# Patient Record
Sex: Male | Born: 1965 | Race: Black or African American | Hispanic: No | Smoking: Current every day smoker
Health system: Southern US, Community
[De-identification: ages and names within clinical notes are randomized; demographics above are authoritative.]

## PROBLEM LIST (undated history)

## (undated) DIAGNOSIS — H469 Unspecified optic neuritis: Secondary | ICD-10-CM

## (undated) DIAGNOSIS — G35 Multiple sclerosis: Principal | ICD-10-CM

## (undated) DIAGNOSIS — G43909 Migraine, unspecified, not intractable, without status migrainosus: Secondary | ICD-10-CM

## (undated) DIAGNOSIS — F5104 Psychophysiologic insomnia: Secondary | ICD-10-CM

## (undated) HISTORY — DX: Unspecified optic neuritis: H46.9

## (undated) HISTORY — DX: Psychophysiologic insomnia: F51.04

## (undated) HISTORY — PX: KNEE SURGERY: SHX244

## (undated) HISTORY — DX: Migraine, unspecified, not intractable, without status migrainosus: G43.909

## (undated) HISTORY — PX: HAND TENDON SURGERY: SHX663

## (undated) HISTORY — DX: Multiple sclerosis: G35

## (undated) HISTORY — PX: HERNIA REPAIR: SHX51

## (undated) HISTORY — PX: ROTATOR CUFF REPAIR: SHX139

---

## 2005-06-18 ENCOUNTER — Encounter: Admission: RE | Admit: 2005-06-18 | Discharge: 2005-06-18 | Payer: Self-pay | Admitting: Family Medicine

## 2006-07-25 ENCOUNTER — Emergency Department (HOSPITAL_COMMUNITY): Admission: EM | Admit: 2006-07-25 | Discharge: 2006-07-25 | Payer: Self-pay | Admitting: Emergency Medicine

## 2006-08-14 ENCOUNTER — Encounter: Admission: RE | Admit: 2006-08-14 | Discharge: 2006-08-14 | Payer: Self-pay | Admitting: Family Medicine

## 2006-09-18 ENCOUNTER — Encounter: Admission: RE | Admit: 2006-09-18 | Discharge: 2006-09-18 | Payer: Self-pay | Admitting: Family Medicine

## 2007-02-10 ENCOUNTER — Ambulatory Visit (HOSPITAL_BASED_OUTPATIENT_CLINIC_OR_DEPARTMENT_OTHER): Admission: RE | Admit: 2007-02-10 | Discharge: 2007-02-10 | Payer: Self-pay | Admitting: General Surgery

## 2010-05-29 NOTE — Op Note (Signed)
NAMEChristopherjohn, Joshua Boyer                ACCOUNT NO.:  [id number]   MEDICAL RECORD NO.:  [id number]          PATIENT TYPE:  AMB   LOCATION:  DSC                          FACILITY:  [facility]   PHYSICIAN:  Cherylynn Ridges, M.D.    DATE OF BIRTH:  23-Sep-1965   DATE OF PROCEDURE:  02/10/2007  DATE OF DISCHARGE:                               OPERATIVE REPORT   PREOPERATIVE DIAGNOSIS:  Left inguinal hernia.   POSTOPERATIVE DIAGNOSIS:  Left indirect inguinal hernia.   PROCEDURE:  Repair of left inguinal hernia with mesh.   SURGEON:  Cherylynn Ridges, M.D.   ANESTHESIA:  General with laryngeal airway.   ESTIMATED BLOOD LOSS:  Less than 10 mL.   COMPLICATIONS:  None.   CONDITION:  Stable.   INDICATIONS FOR OPERATION:  The patient is a 45 year old with  symptomatic left inguinal hernia who comes in now for repair.   FINDINGS:  He had a scarred left indirect inguinal hernia with mild  weakness of the floor.   OPERATION:  The patient was taken to the operating room, placed on the  table in supine position.  After an adequate general laryngeal airway  anesthetic was administered, he was prepped and draped in usual sterile  manner exposing the left side.   We made a transverse curvilinear incision at the level of the  superficial ring using a #10 blade and taken down to the external  oblique fascia which was then opened along its fibers down through the  superficial ring.  The ilioinguinal nerve could be easily identified and  was mobilized along with the spermatic cord.  We placed the spermatic  cord up on a work bench using a Penrose drain then dissected out the  hernia sac on the anterior medial aspect of the cord.  This sac was  dissected away from the spermatic cord and then suture ligated at its  base x2 with 0 Ethibond suture.  The excess sac was transected and  removed, then we repaired the floor using an oval piece of polypropylene  mesh measuring approximately 4.5 x 2 cm in size  attaching it to the  conjoined tendon anteromedially and reflected portion of the inguinal  ligament inferolaterally.  This was done with a running 0 Prolene  suture.  Once the mesh was in place and it was made sure that the  ilioinguinal nerve which came out at the internal ring was not  entrapped, we closed the external oblique fascia on top of the cord and  the ilioinguinal nerve using running 3-0 Vicryl suture.  Again care was  taken not to entrap the nerve.  The Scarpa's fascia was reapproximated  using interrupted 3-0 Vicryl sutures and the  skin, after injecting with 0.50% Marcaine without epinephrine, was  closed using running subcuticular stitch of 4-0 Monocryl.  Dermabond,  Steri-Strips and Tegaderm were applied.  All counts were correct  including needles, sponges and instruments.      Cherylynn Ridges, M.D.  Electronically Signed     JOW/MEDQ  D:  02/10/2007  T:  02/10/2007  Job:  [id number]

## 2010-10-04 LAB — COMPREHENSIVE METABOLIC PANEL
Albumin: 4.3
Creatinine, Ser: 0.95
GFR calc Af Amer: >60
Sodium: 139
Total Bilirubin: 0.4

## 2010-10-04 LAB — CBC
Hemoglobin: 14.4
MCV: 92.5
RDW: 13.9
WBC: 11.8 — ABNORMAL HIGH

## 2010-10-04 LAB — DIFFERENTIAL
Basophils Absolute: 0
Basophils Relative: 0
Eosinophils Absolute: 0.3
Eosinophils Relative: 2
Lymphs Abs: 4
Neutro Abs: 6.5
Neutrophils Relative %: 55

## 2012-04-18 ENCOUNTER — Emergency Department (HOSPITAL_BASED_OUTPATIENT_CLINIC_OR_DEPARTMENT_OTHER)
Admission: EM | Admit: 2012-04-18 | Discharge: 2012-04-18 | Disposition: A | Discharge status: Home / Self Care | Payer: BC Managed Care – PPO | Attending: Emergency Medicine | Admitting: Emergency Medicine

## 2012-04-18 ENCOUNTER — Encounter (HOSPITAL_BASED_OUTPATIENT_CLINIC_OR_DEPARTMENT_OTHER): Payer: Self-pay

## 2012-04-18 ENCOUNTER — Emergency Department (HOSPITAL_BASED_OUTPATIENT_CLINIC_OR_DEPARTMENT_OTHER): Payer: BC Managed Care – PPO

## 2012-04-18 DIAGNOSIS — W208XXA Other cause of strike by thrown, projected or falling object, initial encounter: Secondary | ICD-10-CM | POA: Insufficient documentation

## 2012-04-18 DIAGNOSIS — Z23 Encounter for immunization: Secondary | ICD-10-CM | POA: Insufficient documentation

## 2012-04-18 DIAGNOSIS — S61209A Unspecified open wound of unspecified finger without damage to nail, initial encounter: Secondary | ICD-10-CM | POA: Insufficient documentation

## 2012-04-18 DIAGNOSIS — Z9889 Other specified postprocedural states: Secondary | ICD-10-CM | POA: Insufficient documentation

## 2012-04-18 DIAGNOSIS — Y939 Activity, unspecified: Secondary | ICD-10-CM | POA: Insufficient documentation

## 2012-04-18 DIAGNOSIS — S62639A Displaced fracture of distal phalanx of unspecified finger, initial encounter for closed fracture: Secondary | ICD-10-CM | POA: Insufficient documentation

## 2012-04-18 DIAGNOSIS — Z79899 Other long term (current) drug therapy: Secondary | ICD-10-CM | POA: Insufficient documentation

## 2012-04-18 DIAGNOSIS — G35 Multiple sclerosis: Secondary | ICD-10-CM | POA: Insufficient documentation

## 2012-04-18 DIAGNOSIS — S62639B Displaced fracture of distal phalanx of unspecified finger, initial encounter for open fracture: Secondary | ICD-10-CM

## 2012-04-18 DIAGNOSIS — S61319A Laceration without foreign body of unspecified finger with damage to nail, initial encounter: Secondary | ICD-10-CM

## 2012-04-18 DIAGNOSIS — Y9289 Other specified places as the place of occurrence of the external cause: Secondary | ICD-10-CM | POA: Insufficient documentation

## 2012-04-18 HISTORY — DX: Multiple sclerosis: G35

## 2012-04-18 MED ORDER — HYDROCODONE-ACETAMINOPHEN 5-325 MG PO TABS: 1.0000 | ORAL_TABLET | Freq: Four times a day (QID) | ORAL | Status: DC | PRN

## 2012-04-18 MED ORDER — TETANUS-DIPHTH-ACELL PERTUSSIS 5-2.5-18.5 LF-MCG/0.5 IM SUSP
0.5000 mL | Freq: Once | INTRAMUSCULAR | Status: AC
  Administered 2012-04-18: 0.5 mL via INTRAMUSCULAR
  Filled 2012-04-18: qty 0.5

## 2012-04-18 MED ORDER — CEPHALEXIN 500 MG PO CAPS: 500.0000 mg | ORAL_CAPSULE | Freq: Four times a day (QID) | ORAL | Status: DC

## 2012-04-18 NOTE — ED Provider Notes (Signed)
History     CSN: [id number]  Arrival date & time 04/18/12  1333   First MD Initiated Contact with Patient 04/18/12 1406      Chief Complaint  Patient presents with  . Finger Injury    (Consider location/radiation/quality/duration/timing/severity/associated sxs/prior treatment) HPI Comments: Patient presents with a crushing injury of the right 4th digit.  He reports that approximately one hour prior to arrival a window fell unto his finger crushing it.  He currently has a laceration extending across the right 4th nail.  No treatment prior to arrival.  He reports that the finger is painful.  Bleeding controlled at this time.  He has full ROM of his finger.  Denies numbness or tingling.  Date of last tetanus unknown.  The history is provided by the patient.    Past Medical History  Diagnosis Date  . MS (multiple sclerosis)     Past Surgical History  Procedure Laterality Date  . Hernia repair    . Rotator cuff repair    . Knee surgery    . Hand tendon surgery      History reviewed. No pertinent family history.  History  Substance Use Topics  . Smoking status: Never Smoker   . Smokeless tobacco: Never Used  . Alcohol Use: Yes     Comment: social      Review of Systems  Skin: Positive for wound.    Allergies  Review of patient's allergies indicates no known allergies.  Home Medications   Current Outpatient Rx  Name  Route  Sig  Dispense  Refill  . Fingolimod HCl (GILENYA) 0.5 MG CAPS   Oral   Take 0.5 mg by mouth daily.           BP 139/67  Pulse 86  Temp(Src) 97.9 F (36.6 C) (Oral)  Resp 16  Ht 6\' 4"  (1.93 m)  Wt 191 lb (86.637 kg)  BMI 23.26 kg/m2  SpO2 100%  Physical Exam  Nursing note and vitals reviewed. Constitutional: He appears well-developed and well-nourished. No distress.  HENT:  Head: Normocephalic and atraumatic.  Neck: Normal range of motion. Neck supple.  Cardiovascular: Normal rate, regular rhythm and normal heart sounds.    Pulmonary/Chest: Effort normal and breath sounds normal.  Musculoskeletal:  Full ROM of the right 4th digit MCP, PIP, and DIP.  Neurological: He is alert.  Skin: Skin is warm and dry. He is not diaphoretic.  Laceration through the finger nail of the right 4th digit. Distal portion of the nail elevated.  Lateral edge not intact to the nail bed.  Psychiatric: He has a normal mood and affect.    ED Course  NAIL REMOVAL Date/Time: 04/20/2012 3:11 PM Performed by: Anne Shutter, Jeffey Janssen Authorized by: Anne Shutter, Herbert Seta Consent: Verbal consent obtained. written consent not obtained. Risks and benefits: risks, benefits and alternatives were discussed Consent given by: patient Patient understanding: patient states understanding of the procedure being performed Patient consent: the patient's understanding of the procedure matches consent given Procedure consent: procedure consent matches procedure scheduled Patient identity confirmed: verbally with patient Location: right hand Location details: right ring finger Anesthesia: digital block Local anesthetic: lidocaine 2% without epinephrine Anesthetic total: 5 ml Patient sedated: no Preparation: skin prepped with Betadine Nail removal amount: distal 2/3. Nail bed sutured: yes Suture material: 6-0 Chromic gut Number of sutures: 4 Removed nail replaced and anchored: no Dressing: antibiotic ointment, splint and dressing applied Patient tolerance: Patient tolerated the procedure well with no immediate complications.   (  including critical care time)  Labs Reviewed - No data to display Dg Finger Ring Right  04/18/2012  *RADIOLOGY REPORT*  Clinical Data: Injury.  RIGHT RING FINGER 2+V  Comparison: None.  Findings: Fracture of the tuft of the right fourth finger distal phalanx.  IMPRESSION: Fracture of the tuft of the right fourth finger distal phalanx.   Original Report Authenticated By: Lacy Duverney, M.D.      No diagnosis found.   Patient  discussed with Dr. Fredderick Phenix. MDM  Patient presenting with an injury to the right 4th digit finger nail that occurred after a window fell on it.  Xray shows an underlying tuft fracture.  Finger nail removed and nail bed repaired with sutures.  Patient neurovascularly intact.  Patient discharged home with finger splint and pain medication.  Patient also started on antibiotic due to the fact that it is an open fracture.  Tetanus updated in the ED.        Pascal Lux [place], PA-C 04/20/12 [phone]

## 2012-04-18 NOTE — ED Notes (Signed)
Pt states that he had a crushing injury to his R ring finger, a window fell on it.  Pt went to [facility] who sent him here because they were not comfortable with the injury stating that his fingernail may need to be removed.

## 2012-04-21 NOTE — ED Provider Notes (Signed)
Medical screening examination/treatment/procedure(s) were performed by non-physician practitioner and as supervising physician I was immediately available for consultation/collaboration.   Reginold Beale, MD 04/21/12 0706 

## 2012-08-05 ENCOUNTER — Ambulatory Visit: Payer: Self-pay | Admitting: Neurology

## 2012-08-27 ENCOUNTER — Telehealth: Payer: Self-pay | Admitting: Neurology

## 2012-08-31 NOTE — Telephone Encounter (Signed)
I called patient. The patient had a transient episode of visual disturbance lasting only about 1 minute. Episode was associated with "looking through rain". The patient has a history of migraine headache, and he has had visual disturbances before. This likely represented a migrainous event. The patient is on Gilenya, and he will have a routine ophthalmologic evaluation soon. I do not believe that this visual changes related to the Gilenya.

## 2012-11-11 ENCOUNTER — Telehealth: Payer: Self-pay | Admitting: Neurology

## 2012-11-11 NOTE — Telephone Encounter (Signed)
I called patient. The patient indicates that he went to see the ophthalmologist, and there were no abnormalities seen. The patient will need a revisit to this office. The visual complaints he was having previously have cleared up completely. He is doing well on Gilenya. I will get a revisit sometime within the next 3 weeks or so.

## 2012-11-11 NOTE — Telephone Encounter (Addendum)
Patient said that he recently has seen an eye doctor(10/30/12) and he could not find anything  wrong with his eyes, suggested a  MRI also wanted to know the next step going forward

## 2012-11-13 ENCOUNTER — Telehealth: Payer: Self-pay | Admitting: *Deleted

## 2012-11-27 ENCOUNTER — Encounter: Payer: Self-pay | Admitting: Neurology

## 2012-11-27 ENCOUNTER — Ambulatory Visit (INDEPENDENT_AMBULATORY_CARE_PROVIDER_SITE_OTHER): Payer: BC Managed Care – PPO | Admitting: Neurology

## 2012-11-27 VITALS — BP 122/80 | HR 79 | Wt 204.0 lb

## 2012-11-27 DIAGNOSIS — G35 Multiple sclerosis: Secondary | ICD-10-CM

## 2012-11-27 DIAGNOSIS — H469 Unspecified optic neuritis: Secondary | ICD-10-CM

## 2012-11-27 DIAGNOSIS — Z5181 Encounter for therapeutic drug level monitoring: Secondary | ICD-10-CM

## 2012-11-27 HISTORY — DX: Multiple sclerosis: G35

## 2012-11-27 LAB — COMPREHENSIVE METABOLIC PANEL
Albumin/Globulin Ratio: 1.8 (ref 1.1–2.5)
Albumin: 4.4 g/dL (ref 3.5–5.5)
Alkaline Phosphatase: 54 IU/L (ref 39–117)
BUN/Creatinine Ratio: 17 (ref 9–20)
CO2: 31 mmol/L — ABNORMAL HIGH (ref 18–29)
Calcium: 9.5 mg/dL (ref 8.7–10.2)
GFR calc non Af Amer: 82 mL/min/{1.73_m2} (ref >59)
Glucose: 111 mg/dL — ABNORMAL HIGH (ref 65–99)
Sodium: 141 mmol/L (ref 134–144)
Total Protein: 6.9 g/dL (ref 6.0–8.5)

## 2012-11-27 LAB — CBC WITH DIFFERENTIAL
Basophils Absolute: 0 10*3/uL (ref 0.0–0.2)
HCT: 40.5 % (ref 37.5–51.0)
Hemoglobin: 14 g/dL (ref 12.6–17.7)
Lymphocytes Absolute: 0.7 10*3/uL (ref 0.7–3.1)
MCH: 31.3 pg (ref 26.6–33.0)
MCV: 91 fL (ref 79–97)
Monocytes Absolute: 0.8 10*3/uL (ref 0.1–0.9)
Monocytes: 12 %
Neutrophils Absolute: 4.7 10*3/uL (ref 1.4–7.0)
Neutrophils Relative %: 75 %
WBC: 6.3 10*3/uL (ref 3.4–10.8)

## 2012-11-27 MED ORDER — FINGOLIMOD HCL 0.5 MG PO CAPS: 0.5000 mg | ORAL_CAPSULE | Freq: Every day | ORAL | Status: DC

## 2012-11-27 MED ORDER — VITAMIN D3 25 MCG (1000 UT) PO CAPS: 1000.0000 [IU] | ORAL_CAPSULE | Freq: Every day | ORAL | Status: AC

## 2012-11-27 NOTE — Progress Notes (Signed)
    Reason for visit: Multiple sclerosis  Joshua Boyer is an 47 y.o. male  History of present illness:  Mr. Joshua Boyer is a 47 year old right-handed black male with a history of multiple sclerosis. The patient has a history of a left optic neuritis. The patient was placed on Gilenya given a very active MRI brain study for MS lesions. The patient has done quite well on Gilenya, without any side effects. Occasionally, he will note some numbness and tingling in the toes at nighttime, but this does not last long. The patient denies any balance problems, or problems controlling the bowels or the bladder. The patient not had any visual field changes, or double vision. The patient reports no weakness of the extremities. The patient denies any significant fatigue. The patient returns to the office today for further evaluation. The patient has been to his ophthalmologist recently, and he has indicated that his vision has actually improved.  Past Medical History  Diagnosis Date  . MS (multiple sclerosis)   . Multiple sclerosis 11/27/2012  . Optic neuritis, left   . Migraine headache     Past Surgical History  Procedure Laterality Date  . Hernia repair    . Rotator cuff repair    . Knee surgery    . Hand tendon surgery      Family History  Problem Relation Age of Onset  . Glaucoma Mother   . Diabetes Mother   . Stroke Father   . Heart attack Father   . Cancer - Prostate Father     Social history:  reports that he has quit smoking. He has never used smokeless tobacco. He reports that he drinks alcohol. He reports that he does not use illicit drugs.   No Known Allergies  Medications:  No current outpatient prescriptions on file prior to visit.   No current facility-administered medications on file prior to visit.    ROS:  Out of a complete 14 system review of symptoms, the patient complains only of the following symptoms, and all other reviewed systems are  negative.  Numbness  Blood pressure 122/80, pulse 79, weight 204 lb (92.534 kg).  Physical Exam  General: The patient is alert and cooperative at the time of the examination.  Skin: No significant peripheral edema is noted.   Neurologic Exam  Mental status: The patient is oriented x 3.  Cranial nerves: Facial symmetry is present. Speech is normal, no aphasia or dysarthria is noted. Extraocular movements are full. Visual fields are full. Pupils are equal, round, and reactive to light. Discs are flat bilaterally. There may be some optic atrophy bilaterally. With rapid eye movements, there appears to be evidence of mild bilateral INO.  Motor: The patient has good strength in all 4 extremities.  Sensory examination: Soft touch sensation on the face, arms, and legs is symmetric.  Coordination: The patient has good finger-nose-finger and heel-to-shin bilaterally.  Gait and station: The patient has a normal gait. Tandem gait is normal. Romberg is negative. No drift is seen.  Reflexes: Deep tendon reflexes are symmetric.   Assessment/Plan:  One. Multiple sclerosis  2. Left optic neuritis  The patient will continue on Gilenya at this time. The patient will have blood work done today, and he will followup in 6 months. The patient appears to be doing quite well.  Joshua Palau MD 11/27/2012 8:05 PM  [facility] [address] [place], [place] [address]  Phone [phone] Fax [phone]

## 2012-11-27 NOTE — Patient Instructions (Signed)
Multiple Sclerosis Multiple sclerosis (MS) is a disease of the central nervous system. Its cause is unknown. It is more common in the northern states than in the southern states. There is a higher incidence of MS in women. There is a wide variation in the symptoms (problems) of MS. This is because of the many different ways it affects the central nervous system. It often comes on in episodes or attacks. These attacks may last weeks to months. There may be long periods of nearly no problems between attacks. The main symptoms include visual problems (associated with eye pain), numbness, weakness, and paralysis in extremities (arms/hands and legs/feet). There may also be tremors and problems with balance and walking. The age when MS starts is variable. Advances in medicine continue to improve the treatment of this illness. There is no known cure for MS but there are medications that help. MS is not an inherited illness, although your risk of getting this disease is higher if you have a relative with MS. The best radiologic (x-ray) study for MS is an MRI (magnetic resonance imaging). There are medications available to decrease the number and frequency of attacks. SYMPTOMS  The symptoms of MS are caused by loss of insulation (myelin) of the nerves of the brain. When this happens, brain signals do not get transmitted properly or may not get transmitted at all. Some of the problems caused by this include:   Numbness.  Weakness.  Paralysis in extremities.  Visual problems, eye pain.  Balance problems.  Tremors. DIAGNOSIS  Your caregiver can do studies on you to make this diagnosis. This may include specialized X-rays and spinal fluid studies. HOME CARE INSTRUCTIONS   Take medications as directed by your caregiver. Baclofen is a drug commonly used to reduce muscle spasticity. Steroids are often used for short term relief.  Exercise as directed.  Use physical and occupational therapy as directed by  your caregiver. Careful attention to this medical care can help avoid depression.  See your caregiver if you begin to have problems with depression. This is a common problem in MS. Patients often continue to work many years after the diagnosis of MS. Document Released: 12/29/1999 Document Revised: 03/25/2011 Document Reviewed: 08/06/2006 [employer] Patient Information 2014 [employer].  

## 2012-12-16 ENCOUNTER — Other Ambulatory Visit: Payer: Self-pay | Admitting: Neurology

## 2012-12-16 ENCOUNTER — Ambulatory Visit (INDEPENDENT_AMBULATORY_CARE_PROVIDER_SITE_OTHER): Payer: BC Managed Care – PPO

## 2012-12-16 DIAGNOSIS — G35 Multiple sclerosis: Secondary | ICD-10-CM

## 2012-12-18 ENCOUNTER — Telehealth: Payer: Self-pay | Admitting: Neurology

## 2012-12-18 NOTE — Telephone Encounter (Signed)
I called patient. MRI study of the brain shows minimal progression, possibly 2 new spots. The patient could not get IV contrast secondary to inability to obtain IV access. If the patient has any clinical progression, or some progression is noted on another study, we will consider switching to dysarthria. For now, we'll stay with Gilenya. I would repeat a MRI the brain in 8-12 months.

## 2012-12-20 ENCOUNTER — Telehealth: Payer: Self-pay

## 2012-12-20 NOTE — Telephone Encounter (Signed)
[employer] sent [place] a letter saying they have approved our request for coverage on Gilenya effective until 01/02/2014 Ref #[id number]

## 2013-02-26 ENCOUNTER — Ambulatory Visit: Payer: Self-pay | Admitting: Neurology

## 2013-05-31 ENCOUNTER — Telehealth: Payer: Self-pay | Admitting: Nurse Practitioner

## 2013-05-31 ENCOUNTER — Ambulatory Visit: Payer: BC Managed Care – PPO | Admitting: Nurse Practitioner

## 2013-05-31 NOTE — Telephone Encounter (Signed)
No show for appt. 

## 2013-06-01 ENCOUNTER — Ambulatory Visit (INDEPENDENT_AMBULATORY_CARE_PROVIDER_SITE_OTHER): Payer: BC Managed Care – PPO | Admitting: Nurse Practitioner

## 2013-06-01 ENCOUNTER — Encounter: Payer: Self-pay | Admitting: Nurse Practitioner

## 2013-06-01 VITALS — BP 120/72 | HR 92 | Ht 74.0 in | Wt 205.0 lb

## 2013-06-01 DIAGNOSIS — H469 Unspecified optic neuritis: Secondary | ICD-10-CM

## 2013-06-01 DIAGNOSIS — Z5181 Encounter for therapeutic drug level monitoring: Secondary | ICD-10-CM

## 2013-06-01 DIAGNOSIS — G35 Multiple sclerosis: Secondary | ICD-10-CM

## 2013-06-01 NOTE — Progress Notes (Signed)
I have read the note, and I agree with the clinical assessment and plan.  Joshua Boyer   

## 2013-06-01 NOTE — Patient Instructions (Signed)
Continue gilenya at current dose Repeat MRI of the brain in October CBC , CMP today Followup in 6 months

## 2013-06-01 NOTE — Progress Notes (Signed)
[facility]  PATIENT: Joshua Boyer DOB: 31-Jan-1965   REASON FOR VISIT:follow up for MS   HISTORY OF PRESENT ILLNESS: Joshua Boyer, 48 year old male returns for followup. He was last seen in this office or Dr. Anne HahnWillis 11/27/2012. He has a history of multiple sclerosis and left optic neuritis. Most recent MRI of the brain in December 2014 shows minimal progression, possibly 2 new spots. The patient could not get IV contrast secondary to inability to obtain IV access. He is currently on gilenya without any side effects. He denies any numbness or tingling, balance issues, trouble controlling the  bowels or bladder. He states that his vision has improved. He denies any weakness, he returns for reevaluation  HISTORY: of multiple sclerosis. The patient has a history of a left optic neuritis. The patient was placed on Gilenya given a very active MRI brain study for MS lesions. The patient has done quite well on Gilenya, without any side effects. Occasionally, he will note some numbness and tingling in the toes at nighttime, but this does not last long. The patient denies any balance problems, or problems controlling the bowels or the bladder. The patient not had any visual field changes, or double vision. The patient reports no weakness of the extremities. The patient denies any significant fatigue. The patient returns to the office today for further evaluation. The patient has been to his ophthalmologist recently, and he has indicated that his vision has actually improved.   REVIEW OF SYSTEMS: Full 14 system review of systems performed and notable only for those listed, all others are neg:  Constitutional: N/A  Cardiovascular: N/A  Ear/Nose/Throat: N/A  Skin: N/A  Eyes: N/A  Respiratory: N/A  Gastroitestinal: N/A  Hematology/Lymphatic: N/A  Endocrine: N/A Musculoskeletal:N/A  Allergy/Immunology: N/A  Neurological: N/A Psychiatric: N/A Sleep : Shift work  ALLERGIES: No  Known Allergies  HOME MEDICATIONS: Outpatient Prescriptions Prior to Visit  Medication Sig Dispense Refill  . Cholecalciferol (VITAMIN D3) 1000 UNITS CAPS Take 1 capsule (1,000 Units total) by mouth daily.      . Fingolimod HCl (GILENYA) 0.5 MG CAPS Take 1 capsule (0.5 mg total) by mouth daily.  90 capsule  3  . naproxen sodium (ANAPROX) 220 MG tablet Take 220 mg by mouth as needed.        No facility-administered medications prior to visit.    PAST MEDICAL HISTORY: Past Medical History  Diagnosis Date  . MS (multiple sclerosis)   . Multiple sclerosis 11/27/2012  . Optic neuritis, left   . Migraine headache     PAST SURGICAL HISTORY: Past Surgical History  Procedure Laterality Date  . Hernia repair    . Rotator cuff repair    . Knee surgery    . Hand tendon surgery      FAMILY HISTORY: Family History  Problem Relation Age of Onset  . Glaucoma Mother   . Diabetes Mother   . Stroke Father   . Heart attack Father   . Cancer - Prostate Father     SOCIAL HISTORY: History   Social History  . Marital Status: Divorced    Spouse Name: N/A    Number of Children: 1  . Years of Education: BS    Occupational History  .  [employer]   Social History Main Topics  . Smoking status: Former Games developermoker  . Smokeless tobacco: Never Used  . Alcohol Use: Yes     Comment: social  . Drug Use: No  . Sexual Activity: No  Other Topics Concern  . Not on file   Social History Narrative   Patient lives at home alone.    Patient has 1 child.    Patient is currently working.    Patient is right handed.    Patient has his BS degree.            PHYSICAL EXAM  Filed Vitals:   06/01/13 1458  BP: 120/72  Pulse: 92  Height: 6\' 2"  (1.88 m)  Weight: 205 lb (92.987 kg)   Body mass index is 26.31 kg/(m^2).  Generalized: Well developed, in no acute distress  Head: normocephalic and atraumatic,. Oropharynx benign  Neck: Supple, no carotid bruits  Cardiac: Regular rate rhythm, no  murmur  Musculoskeletal: No deformity   Neurological examination   Mentation: Alert oriented to time, place, history taking. Follows all commands speech and language fluent  Cranial nerve II-XII: Visual acuity 20/20 right, 2040 left . Fundoscopic exam reveals sharp disc margins.Pupils were equal round reactive to light extraocular movements were full, visual field were full on confrontational test. Facial sensation and strength were normal. hearing was intact to finger rubbing bilaterally. Uvula tongue midline. head turning and shoulder shrug were normal and symmetric.Tongue protrusion into cheek strength was normal. Motor: normal bulk and tone, full strength in the BUE, BLE,  No focal weakness Sensory: normal and symmetric to light touch, pinprick, and  vibration  Coordination: finger-nose-finger, heel-to-shin bilaterally, no dysmetria Reflexes: Brachioradialis 2/2, biceps 2/2, triceps 2/2, patellar 2/2, Achilles 2/2, plantar responses were flexor bilaterally. Gait and Station: Rising up from seated position without assistance, normal stance,  moderate stride, good arm swing, smooth turning, able to perform tiptoe, and heel walking without difficulty. Tandem gait is steady  DIAGNOSTIC DATA (LABS, IMAGING, TESTING) - I reviewed patient records, labs, notes, testing and imaging myself where available.  Lab Results  Component Value Date   WBC 6.3 11/27/2012   HGB 14.0 11/27/2012   HCT 40.5 11/27/2012   MCV 91 11/27/2012   PLT 281 11/27/2012      Component Value Date/Time   NA 141 11/27/2012 1312   NA 139 02/09/2007 1130   K 4.5 11/27/2012 1312   CL 102 11/27/2012 1312   CO2 31* 11/27/2012 1312   GLUCOSE 111* 11/27/2012 1312   GLUCOSE 98 02/09/2007 1130   BUN 18 11/27/2012 1312   BUN 11 02/09/2007 1130   CREATININE 1.08 11/27/2012 1312   CALCIUM 9.5 11/27/2012 1312   PROT 6.9 11/27/2012 1312   PROT 7.1 02/09/2007 1130   ALBUMIN 4.3 02/09/2007 1130   AST 16 11/27/2012 1312   ALT 18  11/27/2012 1312   ALKPHOS 54 11/27/2012 1312   BILITOT 0.4 11/27/2012 1312   GFRNONAA 82 11/27/2012 1312   GFRAA 95 11/27/2012 1312    ASSESSMENT AND PLAN  48 y.o. year old male  has a past medical history of Multiple sclerosis (11/27/2012); Optic neuritis, left; and Migraine headache. here to followup.  Continue gilenya at current dose Repeat MRI of the brain in October and compare to 12/14 CBC , CMP today Followup in 6 months Nilda Riggs, [facility], [facility], APRN  [facility] [address] [place], [place] [address] [phone]

## 2013-06-02 LAB — COMPREHENSIVE METABOLIC PANEL
ALK PHOS: 67 IU/L (ref 39–117)
ALT: 27 IU/L (ref 0–44)
AST: 21 IU/L (ref 0–40)
Albumin/Globulin Ratio: 2 (ref 1.1–2.5)
Albumin: 4.7 g/dL (ref 3.5–5.5)
BUN / CREAT RATIO: 14 (ref 9–20)
BUN: 17 mg/dL (ref 6–24)
CALCIUM: 10.1 mg/dL (ref 8.7–10.2)
CHLORIDE: 99 mmol/L (ref 97–108)
CO2: 28 mmol/L (ref 18–29)
Creatinine, Ser: 1.18 mg/dL (ref 0.76–1.27)
GFR calc Af Amer: 85 mL/min/{1.73_m2} (ref >59)
GFR calc non Af Amer: 74 mL/min/{1.73_m2} (ref >59)
Globulin, Total: 2.4 g/dL (ref 1.5–4.5)
Glucose: 113 mg/dL — ABNORMAL HIGH (ref 65–99)
POTASSIUM: 4.7 mmol/L (ref 3.5–5.2)
SODIUM: 142 mmol/L (ref 134–144)
Total Bilirubin: 0.2 mg/dL (ref 0.0–1.2)
Total Protein: 7.1 g/dL (ref 6.0–8.5)

## 2013-06-02 LAB — CBC WITH DIFFERENTIAL/PLATELET
BASOS: 0 %
Basophils Absolute: 0 10*3/uL (ref 0.0–0.2)
EOS: 1 %
Eosinophils Absolute: 0.1 10*3/uL (ref 0.0–0.4)
HEMATOCRIT: 41.2 % (ref 37.5–51.0)
Hemoglobin: 13.9 g/dL (ref 12.6–17.7)
IMMATURE GRANS (ABS): 0 10*3/uL (ref 0.0–0.1)
IMMATURE GRANULOCYTES: 0 %
LYMPHS: 6 %
Lymphocytes Absolute: 0.7 10*3/uL (ref 0.7–3.1)
MCH: 30.3 pg (ref 26.6–33.0)
MCHC: 33.7 g/dL (ref 31.5–35.7)
MCV: 90 fL (ref 79–97)
MONOCYTES: 9 %
MONOS ABS: 0.9 10*3/uL (ref 0.1–0.9)
NEUTROS PCT: 84 %
Neutrophils Absolute: 8.6 10*3/uL — ABNORMAL HIGH (ref 1.4–7.0)
RBC: 4.58 x10E6/uL (ref 4.14–5.80)
RDW: 14.4 % (ref 12.3–15.4)
WBC: 10.2 10*3/uL (ref 3.4–10.8)

## 2013-06-02 NOTE — Progress Notes (Signed)
Quick Note:  Called and shared labs with patient, he verbalized understanding ______

## 2013-06-02 NOTE — Telephone Encounter (Signed)
This encounter was created in error - please disregard.

## 2013-10-29 ENCOUNTER — Telehealth: Payer: Self-pay | Admitting: Nurse Practitioner

## 2013-10-29 DIAGNOSIS — G35 Multiple sclerosis: Secondary | ICD-10-CM

## 2013-10-29 NOTE — Telephone Encounter (Signed)
Called patient to remind him that we had planned to do repeat MRI of the brain and compare to 2014. He would like to have this set up. Order written. Made him aware someone would call and schedule.

## 2013-11-24 DIAGNOSIS — G35 Multiple sclerosis: Secondary | ICD-10-CM

## 2013-11-26 ENCOUNTER — Other Ambulatory Visit: Payer: Self-pay | Admitting: Neurology

## 2013-11-26 DIAGNOSIS — G35 Multiple sclerosis: Secondary | ICD-10-CM

## 2013-11-30 ENCOUNTER — Encounter: Payer: Self-pay | Admitting: Nurse Practitioner

## 2013-11-30 ENCOUNTER — Ambulatory Visit (INDEPENDENT_AMBULATORY_CARE_PROVIDER_SITE_OTHER): Payer: BC Managed Care – PPO | Admitting: Nurse Practitioner

## 2013-11-30 VITALS — BP 128/83 | HR 82 | Ht 74.0 in | Wt 205.2 lb

## 2013-11-30 DIAGNOSIS — G35 Multiple sclerosis: Secondary | ICD-10-CM

## 2013-11-30 MED ORDER — FINGOLIMOD HCL 0.5 MG PO CAPS: 0.5000 mg | ORAL_CAPSULE | Freq: Every day | ORAL | Status: DC

## 2013-11-30 NOTE — Progress Notes (Signed)
PATIENT: Joshua Boyer DOB: Mar 09, 1965  REASON FOR VISIT: routine follow up for MS HISTORY FROM: patient  HISTORY OF PRESENT ILLNESS: Joshua Boyer, 48 year old AA male returns for followup. He was last seen in this office with Eber Jones, NP on 06/01/13. He has a history of multiple sclerosis and left optic neuritis. Most recent MRI of the brain completed 2 weeks ago shows no progression of disease. He is currently on gilenya without any side effects. He denies any numbness or tingling, balance issues, trouble controlling the bowels or bladder. He states that his vision has improved. He denies any weakness, he returns for reevaluation.  HISTORY: of multiple sclerosis. The patient has a history of a left optic neuritis. The patient was placed on Gilenya given a very active MRI brain study for MS lesions. The patient has done quite well on Gilenya, without any side effects. Occasionally, he will note some numbness and tingling in the toes at nighttime, but this does not last long. The patient denies any balance problems, or problems controlling the bowels or the bladder. The patient not had any visual field changes, or double vision. The patient reports no weakness of the extremities. The patient denies any significant fatigue. The patient returns to the office today for further evaluation. The patient has been to his ophthalmologist recently, and he has indicated that his vision has actually improved.  REVIEW OF SYSTEMS: Full 14 system review of systems performed and notable only for: shift work  ALLERGIES: No Known Allergies  HOME MEDICATIONS: Outpatient Prescriptions Prior to Visit  Medication Sig Dispense Refill  . Fingolimod HCl (GILENYA) 0.5 MG CAPS Take 1 capsule (0.5 mg total) by mouth daily. 90 capsule 3  . Cholecalciferol (VITAMIN D3) 1000 UNITS CAPS Take 1 capsule (1,000 Units total) by mouth daily.    . naproxen sodium (ANAPROX) 220 MG tablet Take 220 mg by mouth as needed.      No  facility-administered medications prior to visit.    PHYSICAL EXAM Filed Vitals:   11/30/13 1541  BP: 128/83  Pulse: 82  Height: 6\' 2"  (1.88 m)  Weight: 205 lb 3.2 oz (93.078 kg)   Body mass index is 26.33 kg/(m^2).  Generalized: Well developed, in no acute distress   Head: normocephalic and atraumatic,. Oropharynx benign   Neck: Supple, no carotid bruits   Cardiac: Regular rate rhythm, no murmur   Musculoskeletal: No deformity   Neurological examination   Mentation: Alert oriented to time, place, history taking. Follows all commands speech and language fluent Cranial nerve II-XII: Visual acuity 20/20 right, 20/40 left . Pupils were equal round reactive to light extraocular movements were full, visual field were full on confrontational test. Facial sensation and strength were normal. hearing was intact to finger rubbing bilaterally. Uvula tongue midline. head turning and shoulder shrug were normal and symmetric.Tongue protrusion into cheek strength was normal. Motor: normal bulk and tone, full strength in the BUE, BLE,  No focal weakness Sensory: normal and symmetric to light touch, pinprick, and  vibration   Coordination: finger-nose-finger, heel-to-shin bilaterally, no dysmetria Reflexes: Brachioradialis 2/2, biceps 2/2, triceps 2/2, patellar 2/2, Achilles 2/2, plantar responses were flexor bilaterally. Gait and Station: Rising up from seated position without assistance, normal stance,  moderate stride, good arm swing, smooth turning, able to perform tiptoe, and heel walking without difficulty. Tandem gait is steady.  11/24/13  MRI BRAIN:Abnormal MRI scan of the brain showing multiple periventricular, subcortical, and juxtacortical white matter lesions compatible with chronic demyelinating  plaques. No enhancing lesions are noted. Overall no significant change compared with previous MRI scan dated 12/16/2013.  ASSESSMENT: 48 year old AA male has a past medical history of Multiple  sclerosis (11/27/2012); Optic neuritis, left; and Migraine headache here to followup. He is stable on Gilenya, with no progression of disease noted on recent MRI Brain.  PLAN: Continue gilenya at current dose CBC , CMP today. Followup in 6 months, sooner as needed.  Orders Placed This Encounter  Procedures  . CBC With differential/Platelet  . Comprehensive metabolic panel   Tawny AsalLYNN E. Eilam Shrewsbury, MSN, FNP-BC, A/GNP-C 11/30/2013, 4:05 PM [facility] [address], [address] [place], [place] [id number] [phone]  Note: This document was prepared with digital dictation and possible smart phrase technology. Any transcriptional errors that result from this process are unintentional.

## 2013-11-30 NOTE — Patient Instructions (Signed)
Continue Gilenya.  Check labs, CBC and CMP today.  Follow up in 6 months, sooner as needed.

## 2013-11-30 NOTE — Progress Notes (Signed)
I have read the note, and I agree with the clinical assessment and plan.  Casey Fye KEITH   

## 2013-12-01 ENCOUNTER — Ambulatory Visit: Payer: BC Managed Care – PPO | Admitting: Nurse Practitioner

## 2013-12-01 LAB — COMPREHENSIVE METABOLIC PANEL
ALT: 23 IU/L (ref 0–44)
AST: 18 IU/L (ref 0–40)
Albumin/Globulin Ratio: 2 (ref 1.1–2.5)
Albumin: 4.7 g/dL (ref 3.5–5.5)
Alkaline Phosphatase: 56 IU/L (ref 39–117)
BUN/Creatinine Ratio: 12 (ref 9–20)
BUN: 13 mg/dL (ref 6–24)
CO2: 24 mmol/L (ref 18–29)
Calcium: 9.6 mg/dL (ref 8.7–10.2)
Chloride: 102 mmol/L (ref 97–108)
Creatinine, Ser: 1.11 mg/dL (ref 0.76–1.27)
GFR calc Af Amer: 91 mL/min/{1.73_m2} (ref >59)
GFR calc non Af Amer: 79 mL/min/{1.73_m2} (ref >59)
Globulin, Total: 2.4 g/dL (ref 1.5–4.5)
Glucose: 85 mg/dL (ref 65–99)
Potassium: 4.3 mmol/L (ref 3.5–5.2)
Sodium: 143 mmol/L (ref 134–144)
Total Bilirubin: 0.5 mg/dL (ref 0.0–1.2)
Total Protein: 7.1 g/dL (ref 6.0–8.5)

## 2013-12-01 LAB — CBC WITH DIFFERENTIAL
Basophils Absolute: 0 10*3/uL (ref 0.0–0.2)
Basos: 0 %
Eos: 1 %
Eosinophils Absolute: 0.1 10*3/uL (ref 0.0–0.4)
HCT: 41.1 % (ref 37.5–51.0)
Hemoglobin: 14.2 g/dL (ref 12.6–17.7)
Immature Grans (Abs): 0 10*3/uL (ref 0.0–0.1)
Immature Granulocytes: 0 %
Lymphocytes Absolute: 0.9 10*3/uL (ref 0.7–3.1)
Lymphs: 11 %
MCH: 30.9 pg (ref 26.6–33.0)
MCHC: 34.5 g/dL (ref 31.5–35.7)
MCV: 90 fL (ref 79–97)
Monocytes Absolute: 0.8 10*3/uL (ref 0.1–0.9)
Monocytes: 10 %
Neutrophils Absolute: 6 10*3/uL (ref 1.4–7.0)
Neutrophils Relative %: 78 %
Platelets: 300 10*3/uL (ref 150–379)
RBC: 4.59 x10E6/uL (ref 4.14–5.80)
RDW: 14.5 % (ref 12.3–15.4)
WBC: 7.7 10*3/uL (ref 3.4–10.8)

## 2013-12-02 NOTE — Progress Notes (Signed)
Notified patient of normal labs. 

## 2013-12-03 ENCOUNTER — Ambulatory Visit: Payer: BC Managed Care – PPO | Admitting: Nurse Practitioner

## 2014-06-01 ENCOUNTER — Encounter: Payer: Self-pay | Admitting: Neurology

## 2014-06-01 ENCOUNTER — Ambulatory Visit (INDEPENDENT_AMBULATORY_CARE_PROVIDER_SITE_OTHER): Payer: BLUE CROSS/BLUE SHIELD | Admitting: Neurology

## 2014-06-01 VITALS — BP 132/79 | HR 90 | Ht 76.0 in | Wt 217.8 lb

## 2014-06-01 DIAGNOSIS — G35 Multiple sclerosis: Secondary | ICD-10-CM | POA: Diagnosis not present

## 2014-06-01 DIAGNOSIS — H469 Unspecified optic neuritis: Secondary | ICD-10-CM

## 2014-06-01 DIAGNOSIS — Z5181 Encounter for therapeutic drug level monitoring: Secondary | ICD-10-CM | POA: Diagnosis not present

## 2014-06-01 NOTE — Progress Notes (Signed)
Reason for visit: Multiple sclerosis  Joshua Boyer is an 49 y.o. male  History of present illness:  Joshua Boyer is a 49 year old right-handed black male with a history of multiple sclerosis. The patient has had a prior left optic neuritis. Indicates that he has had no residual symptoms from this episode. He is on Gilenya, he is tolerating the medication well. MRI of the brain done in November 2015 showed no progression from the prior study done in 2014. He last had blood work done in the fall of 2015. He reports no new symptoms of numbness, weakness, balance problems, visual changes, or bowel or bladder control issues. He returns to the office today for an evaluation. In general, he does not have issues with fatigue or lack of energy.  Past Medical History  Diagnosis Date  . MS (multiple sclerosis)   . Multiple sclerosis 11/27/2012  . Optic neuritis, left   . Migraine headache     Past Surgical History  Procedure Laterality Date  . Hernia repair    . Rotator cuff repair    . Knee surgery    . Hand tendon surgery      Family History  Problem Relation Age of Onset  . Glaucoma Mother   . Diabetes Mother   . Stroke Father   . Heart attack Father   . Cancer - Prostate Father     Social history:  reports that he has been smoking.  He has never used smokeless tobacco. He reports that he drinks alcohol. He reports that he does not use illicit drugs.   No Known Allergies  Medications:  Prior to Admission medications   Medication Sig Start Date End Date Taking? Authorizing Provider  Cholecalciferol (VITAMIN D3) 1000 UNITS CAPS Take 1 capsule (1,000 Units total) by mouth daily. 11/27/12  Yes York Spaniel, MD  Fingolimod HCl (GILENYA) 0.5 MG CAPS Take 1 capsule (0.5 mg total) by mouth daily. 11/30/13  Yes Ronal Fear, NP    ROS:  Out of a complete 14 system review of symptoms, the patient complains only of the following symptoms, and all other reviewed systems are  negative.  Review of systems is negative  Blood pressure 132/79, pulse 90, height 6\' 4"  (1.93 m), weight 217 lb 12.8 oz (98.793 kg).  Physical Exam  General: The patient is alert and cooperative at the time of the examination.  Skin: No significant peripheral edema is noted.   Neurologic Exam  Mental status: The patient is alert and oriented x 3 at the time of the examination. The patient has apparent normal recent and remote memory, with an apparently normal attention span and concentration ability.   Cranial nerves: Facial symmetry is present. Speech is normal, no aphasia or dysarthria is noted. Extraocular movements are full. Visual fields are full. Pupils are equal, round, and reactive to light. Discs are flat bilaterally.  Motor: The patient has good strength in all 4 extremities.  Sensory examination: Soft touch sensation is symmetric on the face, arms, and legs.  Coordination: The patient has good finger-nose-finger and heel-to-shin bilaterally.  Gait and station: The patient has a normal gait. Tandem gait is normal. Romberg is negative. No drift is seen.  Reflexes: Deep tendon reflexes are symmetric.   MRI brain 11/26/13:  IMPRESSION: Abnormal MRI scan of the brain showing multiple periventricular, subcortical, and juxtacortical white matter lesions compatible with chronic demyelinating plaques. No enhancing lesions are noted. Overall no significant change compared with previous MRI  scan dated 12/16/2012   Assessment/Plan:  1. Multiple sclerosis  2. Prior left optic neuritis  The patient is doing quite well on Gilenya. He is having no side effects on the medication, and he has not had any new focal symptoms. MRI evaluation previously shows good stability. He will continue the medication, he will have blood work done today, he will follow-up in 6 months.  Marlan Palau MD 06/01/2014 7:32 PM  [facility] [address] [place], [place] [address]  Phone [phone] Fax [phone]

## 2014-06-01 NOTE — Patient Instructions (Signed)

## 2014-06-02 ENCOUNTER — Telehealth: Payer: Self-pay

## 2014-06-02 LAB — COMPREHENSIVE METABOLIC PANEL
A/G RATIO: 1.9 (ref 1.1–2.5)
ALT: 21 IU/L (ref 0–44)
AST: 18 IU/L (ref 0–40)
Albumin: 4.5 g/dL (ref 3.5–5.5)
Alkaline Phosphatase: 62 IU/L (ref 39–117)
BILIRUBIN TOTAL: 0.4 mg/dL (ref 0.0–1.2)
BUN / CREAT RATIO: 14 (ref 9–20)
BUN: 16 mg/dL (ref 6–24)
CO2: 24 mmol/L (ref 18–29)
Calcium: 9.6 mg/dL (ref 8.7–10.2)
Chloride: 101 mmol/L (ref 97–108)
Creatinine, Ser: 1.15 mg/dL (ref 0.76–1.27)
GFR, EST AFRICAN AMERICAN: 87 mL/min/{1.73_m2} (ref >59)
GFR, EST NON AFRICAN AMERICAN: 75 mL/min/{1.73_m2} (ref >59)
Globulin, Total: 2.4 g/dL (ref 1.5–4.5)
Glucose: 103 mg/dL — ABNORMAL HIGH (ref 65–99)
POTASSIUM: 4.5 mmol/L (ref 3.5–5.2)
SODIUM: 141 mmol/L (ref 134–144)
Total Protein: 6.9 g/dL (ref 6.0–8.5)

## 2014-06-02 LAB — CBC WITH DIFFERENTIAL/PLATELET
BASOS ABS: 0 10*3/uL (ref 0.0–0.2)
Basos: 0 %
EOS (ABSOLUTE): 0.2 10*3/uL (ref 0.0–0.4)
Eos: 2 %
Hematocrit: 41.8 % (ref 37.5–51.0)
Hemoglobin: 14.2 g/dL (ref 12.6–17.7)
Immature Grans (Abs): 0 10*3/uL (ref 0.0–0.1)
Immature Granulocytes: 0 %
LYMPHS ABS: 1.1 10*3/uL (ref 0.7–3.1)
Lymphs: 12 %
MCH: 30.5 pg (ref 26.6–33.0)
MCHC: 34 g/dL (ref 31.5–35.7)
MCV: 90 fL (ref 79–97)
MONOS ABS: 0.8 10*3/uL (ref 0.1–0.9)
Monocytes: 9 %
Neutrophils Absolute: 7 10*3/uL (ref 1.4–7.0)
Neutrophils: 77 %
PLATELETS: 314 10*3/uL (ref 150–379)
RBC: 4.66 x10E6/uL (ref 4.14–5.80)
RDW: 14.7 % (ref 12.3–15.4)
WBC: 9.1 10*3/uL (ref 3.4–10.8)

## 2014-06-02 NOTE — Telephone Encounter (Signed)
Patient was informed that blood work results are unremarkable.  He has no other questions at time.

## 2014-11-28 ENCOUNTER — Telehealth: Payer: Self-pay | Admitting: Neurology

## 2014-11-28 NOTE — Telephone Encounter (Signed)
[place] [phone] ext [phone] called to get prior authorization for Fingolimod HCl (GILENYA) 0.5 MG CAPS

## 2014-11-28 NOTE — Telephone Encounter (Signed)
I called back to verify prescription benefit ins info.  Spoke with Lanora Manis.  She said this drug does not need a prior auth, they just needed a refill auth.  She then transferred me to Casimiro Needle.  I verified order and they will process request.

## 2014-12-01 ENCOUNTER — Ambulatory Visit (INDEPENDENT_AMBULATORY_CARE_PROVIDER_SITE_OTHER): Payer: BLUE CROSS/BLUE SHIELD | Admitting: Adult Health

## 2014-12-01 ENCOUNTER — Encounter: Payer: Self-pay | Admitting: Adult Health

## 2014-12-01 VITALS — BP 121/78 | HR 79 | Ht 76.0 in | Wt 216.5 lb

## 2014-12-01 DIAGNOSIS — G35 Multiple sclerosis: Secondary | ICD-10-CM

## 2014-12-01 DIAGNOSIS — Z5181 Encounter for therapeutic drug level monitoring: Secondary | ICD-10-CM | POA: Diagnosis not present

## 2014-12-01 NOTE — Patient Instructions (Signed)
Continue Gilenya Check blood work today If your symptoms worsen or you develop new symptoms please let us know.

## 2014-12-01 NOTE — Progress Notes (Signed)
I have read the note, and I agree with the clinical assessment and plan.  Cecily Lawhorne KEITH   

## 2014-12-01 NOTE — Progress Notes (Signed)
PATIENT: Joshua Boyer DOB: Jul 11, 1965  REASON FOR VISIT: follow up- multiple sclerosis HISTORY FROM: patient  HISTORY OF PRESENT ILLNESS: Joshua Boyer is a 49 year old male with a history of multiple sclerosis. He returns today for follow-up. He continues to take Gilenya and tolerates it well. He denies any changes with his gait or balance. Denies any new numbness or weakness. Denies any changes with her vision. No changes with the bowels or bladder. Denies any significant fatigue. Overall he feels that he is doing relatively well. He returns today for an evaluation.  HISTORY 06/01/14 Joshua Boyer): Joshua Boyer is a 49 year old right-handed black male with a history of multiple sclerosis. The patient has had a prior left optic neuritis. Indicates that he has had no residual symptoms from this episode. He is on Gilenya, he is tolerating the medication well. MRI of the brain done in November 2015 showed no progression from the prior study done in 2014. He last had blood work done in the fall of 2015. He reports no new symptoms of numbness, weakness, balance problems, visual changes, or bowel or bladder control issues. He returns to the office today for an evaluation. In general, he does not have issues with fatigue or lack of energy.  REVIEW OF SYSTEMS: Out of a complete 14 system review of symptoms, the patient complains only of the following symptoms, and all other reviewed systems are negative.  See history of present illness  ALLERGIES: No Known Allergies  HOME MEDICATIONS: Outpatient Prescriptions Prior to Visit  Medication Sig Dispense Refill  . Cholecalciferol (VITAMIN D3) 1000 UNITS CAPS Take 1 capsule (1,000 Units total) by mouth daily.    . Fingolimod HCl (GILENYA) 0.5 MG CAPS Take 1 capsule (0.5 mg total) by mouth daily. 90 capsule 3   No facility-administered medications prior to visit.    PAST MEDICAL HISTORY: Past Medical History  Diagnosis Date  . MS (multiple sclerosis)  (HCC)   . Multiple sclerosis (HCC) 11/27/2012  . Optic neuritis, left   . Migraine headache     PAST SURGICAL HISTORY: Past Surgical History  Procedure Laterality Date  . Hernia repair    . Rotator cuff repair    . Knee surgery    . Hand tendon surgery      FAMILY HISTORY: Family History  Problem Relation Age of Onset  . Glaucoma Mother   . Diabetes Mother   . Stroke Father   . Heart attack Father   . Cancer - Prostate Father     SOCIAL HISTORY: Social History   Social History  . Marital Status: Divorced    Spouse Name: N/A  . Number of Children: 1  . Years of Education: BS    Occupational History  .  [employer]   Social History Main Topics  . Smoking status: Current Every Day Smoker -- 0.75 packs/day  . Smokeless tobacco: Never Used  . Alcohol Use: 0.0 oz/week    0 Standard drinks or equivalent per week     Comment: social  . Drug Use: No  . Sexual Activity: No   Other Topics Concern  . Not on file   Social History Narrative   Patient lives at home alone.    Patient has 1 child.    Patient is currently working.    Patient is right handed.    Patient has his BS degree.   Patient drinks 1-2 cups of caffeine daily.            PHYSICAL  EXAM  Filed Vitals:   12/01/14 1557  BP: 121/78  Pulse: 79  Height: 6\' 4"  (1.93 m)  Weight: 216 lb 8 oz (98.204 kg)   Body mass index is 26.36 kg/(m^2).  Generalized: Well developed, in no acute distress   Neurological examination  Mentation: Alert oriented to time, place, history taking. Follows all commands speech and language fluent Cranial nerve II-XII: Pupils were equal round reactive to light. Extraocular movements were full, visual field were full on confrontational test. Facial sensation and strength were normal. Uvula tongue midline. Head turning and shoulder shrug  were normal and symmetric. Motor: The motor testing reveals 5 over 5 strength of all 4 extremities. Good symmetric motor tone is noted  throughout.  Sensory: Sensory testing is intact to soft touch on all 4 extremities. No evidence of extinction is noted.  Coordination: Cerebellar testing reveals good finger-nose-finger and heel-to-shin bilaterally.  Gait and station: Gait is normal. Tandem gait is normal. Romberg is negative. No drift is seen.  Reflexes: Deep tendon reflexes are symmetric and normal bilaterally.   DIAGNOSTIC DATA (LABS, IMAGING, TESTING) - I reviewed patient records, labs, notes, testing and imaging myself where available.  Lab Results  Component Value Date   WBC 9.1 06/01/2014   HGB 14.2 11/30/2013   HCT 41.8 06/01/2014   MCV 90 11/30/2013   PLT 300 11/30/2013      Component Value Date/Time   NA 141 06/01/2014 1032   NA 139 02/09/2007 1130   K 4.5 06/01/2014 1032   CL 101 06/01/2014 1032   CO2 24 06/01/2014 1032   GLUCOSE 103* 06/01/2014 1032   GLUCOSE 98 02/09/2007 1130   BUN 16 06/01/2014 1032   BUN 11 02/09/2007 1130   CREATININE 1.15 06/01/2014 1032   CALCIUM 9.6 06/01/2014 1032   PROT 6.9 06/01/2014 1032   PROT 7.1 02/09/2007 1130   ALBUMIN 4.5 06/01/2014 1032   ALBUMIN 4.3 02/09/2007 1130   AST 18 06/01/2014 1032   ALT 21 06/01/2014 1032   ALKPHOS 62 06/01/2014 1032   BILITOT 0.4 06/01/2014 1032   BILITOT 0.5 11/30/2013 1609   GFRNONAA 75 06/01/2014 1032   GFRAA 87 06/01/2014 1032      ASSESSMENT AND PLAN 49 y.o. year old male  has a past medical history of MS (multiple sclerosis) (HCC); Multiple sclerosis (HCC) (11/27/2012); Optic neuritis, left; and Migraine headache. here with:  1. Multiple sclerosis  Overall the patient is doing well. He will continue on Gilenya. I will check blood work today. In the future we will consider repeating MRI of the brain. Patient advised that if his symptoms worsen or he develops any new symptoms he should let us know. He will follow-up in 6 months or sooner if needed.  Butch Penny, MSN, NP-C 12/01/2014, 4:08 PM [facility] [address] [place], [place] [address] [phone]

## 2014-12-02 ENCOUNTER — Ambulatory Visit: Payer: BLUE CROSS/BLUE SHIELD | Admitting: Adult Health

## 2014-12-02 LAB — COMPREHENSIVE METABOLIC PANEL
A/G RATIO: 1.7 (ref 1.1–2.5)
ALT: 23 IU/L (ref 0–44)
AST: 18 IU/L (ref 0–40)
Albumin: 4.3 g/dL (ref 3.5–5.5)
Alkaline Phosphatase: 62 IU/L (ref 39–117)
BUN/Creatinine Ratio: 13 (ref 9–20)
BUN: 16 mg/dL (ref 6–24)
Bilirubin Total: 0.2 mg/dL (ref 0.0–1.2)
CALCIUM: 9.3 mg/dL (ref 8.7–10.2)
CO2: 23 mmol/L (ref 18–29)
CREATININE: 1.23 mg/dL (ref 0.76–1.27)
Chloride: 101 mmol/L (ref 97–106)
GFR, EST AFRICAN AMERICAN: 80 mL/min/{1.73_m2} (ref >59)
GFR, EST NON AFRICAN AMERICAN: 69 mL/min/{1.73_m2} (ref >59)
Globulin, Total: 2.6 g/dL (ref 1.5–4.5)
Glucose: 92 mg/dL (ref 65–99)
Potassium: 4.1 mmol/L (ref 3.5–5.2)
Sodium: 141 mmol/L (ref 136–144)
TOTAL PROTEIN: 6.9 g/dL (ref 6.0–8.5)

## 2014-12-02 LAB — CBC WITH DIFFERENTIAL/PLATELET
BASOS: 0 %
Basophils Absolute: 0 10*3/uL (ref 0.0–0.2)
EOS (ABSOLUTE): 0.2 10*3/uL (ref 0.0–0.4)
Eos: 2 %
Hematocrit: 40.3 % (ref 37.5–51.0)
Hemoglobin: 13.6 g/dL (ref 12.6–17.7)
IMMATURE GRANS (ABS): 0 10*3/uL (ref 0.0–0.1)
Immature Granulocytes: 0 %
LYMPHS: 10 %
Lymphocytes Absolute: 0.8 10*3/uL (ref 0.7–3.1)
MCH: 30.2 pg (ref 26.6–33.0)
MCHC: 33.7 g/dL (ref 31.5–35.7)
MCV: 90 fL (ref 79–97)
Monocytes Absolute: 0.8 10*3/uL (ref 0.1–0.9)
Monocytes: 10 %
Neutrophils Absolute: 6.2 10*3/uL (ref 1.4–7.0)
Neutrophils: 78 %
PLATELETS: 301 10*3/uL (ref 150–379)
RBC: 4.5 x10E6/uL (ref 4.14–5.80)
RDW: 14.5 % (ref 12.3–15.4)
WBC: 8 10*3/uL (ref 3.4–10.8)

## 2014-12-05 ENCOUNTER — Telehealth: Payer: Self-pay

## 2014-12-05 NOTE — Telephone Encounter (Signed)
Spoke to patient. Gave lab results. Patient verbalized understanding.  

## 2014-12-05 NOTE — Telephone Encounter (Signed)
-----   Message from Butch Penny, NP sent at 12/05/2014  7:37 AM EST ----- Lab work is normal. Please call the patient.

## 2014-12-12 NOTE — Telephone Encounter (Signed)
When we called previously, we were told no prior auth was required.  Ins has been contacted again and provided with clinical info.  Request is currently being reviewed Ref # [id number]  [facility] has approved the request for coverage on Gilenya effective until 01/04/2017, or until the policy changes or is terminated Ref PA# [id number] PG

## 2014-12-12 NOTE — Telephone Encounter (Signed)
[place] with [facility] called sts prior auth form was faxed 12/05/14. She is calling for status of this. Please call and advise at [phone]

## 2015-02-22 ENCOUNTER — Other Ambulatory Visit: Payer: Self-pay

## 2015-02-22 MED ORDER — FINGOLIMOD HCL 0.5 MG PO CAPS: 0.5000 mg | ORAL_CAPSULE | Freq: Every day | ORAL | Status: DC

## 2015-02-23 ENCOUNTER — Other Ambulatory Visit: Payer: Self-pay | Admitting: Neurology

## 2015-06-01 ENCOUNTER — Ambulatory Visit: Payer: BLUE CROSS/BLUE SHIELD | Admitting: Adult Health

## 2015-06-05 ENCOUNTER — Ambulatory Visit (INDEPENDENT_AMBULATORY_CARE_PROVIDER_SITE_OTHER): Payer: BLUE CROSS/BLUE SHIELD | Admitting: Adult Health

## 2015-06-05 ENCOUNTER — Encounter: Payer: Self-pay | Admitting: Adult Health

## 2015-06-05 VITALS — BP 116/78 | HR 72 | Ht 76.0 in | Wt 218.6 lb

## 2015-06-05 DIAGNOSIS — Z5181 Encounter for therapeutic drug level monitoring: Secondary | ICD-10-CM | POA: Diagnosis not present

## 2015-06-05 DIAGNOSIS — G35 Multiple sclerosis: Secondary | ICD-10-CM | POA: Diagnosis not present

## 2015-06-05 NOTE — Progress Notes (Signed)
I have read the note, and I agree with the clinical assessment and plan.  WILLIS,CHARLES KEITH   

## 2015-06-05 NOTE — Progress Notes (Signed)
PATIENT: ARREN EDLUND DOB: [birth year]  REASON FOR VISIT: follow up- multiple sclerosis HISTORY FROM: patient  HISTORY OF PRESENT ILLNESS: Mr. Hor is a 50 year old male with a history of multiple sclerosis. He returns today for follow-up. He continues to take Gilenya and tolerates it well. He denies any significant changes. He does report occasionally he'll have a tingling sensation in the left foot and toes but this sensation only last for seconds. He reports that is very rare. Denies changes with his gait or balance. Denies any changes with the bowels or bladder. No change in his vision. Denies any new numbness or weakness. He returns today for an evaluation.  HISTORY 12/01/14:Mr. Desalvo is a 50 year old male with a history of multiple sclerosis. He returns today for follow-up. He continues to take Gilenya and tolerates it well. He denies any changes with his gait or balance. Denies any new numbness or weakness. Denies any changes with her vision. No changes with the bowels or bladder. Denies any significant fatigue. Overall he feels that he is doing relatively well. He returns today for an evaluation.  HISTORY 06/01/14 Anne Hahn): Mr. Balthaser is a 50 year old right-handed black male with a history of multiple sclerosis. The patient has had a prior left optic neuritis. Indicates that he has had no residual symptoms from this episode. He is on Gilenya, he is tolerating the medication well. MRI of the brain done in November 2015 showed no progression from the prior study done in 2014. He last had blood work done in the fall of 2015. He reports no new symptoms of numbness, weakness, balance problems, visual changes, or bowel or bladder control issues. He returns to the office today for an evaluation. In general, he does not have issues with fatigue or lack of energy.   REVIEW OF SYSTEMS: Out of a complete 14 system review of symptoms, the patient complains only of the following symptoms, and all  other reviewed systems are negative.  See history of present illness  ALLERGIES: No Known Allergies  HOME MEDICATIONS: Outpatient Prescriptions Prior to Visit  Medication Sig Dispense Refill  . Cholecalciferol (VITAMIN D3) 1000 UNITS CAPS Take 1 capsule (1,000 Units total) by mouth daily.    . Fingolimod HCl (GILENYA) 0.5 MG CAPS Take 1 capsule (0.5 mg total) by mouth daily. 90 capsule 3   No facility-administered medications prior to visit.    PAST MEDICAL HISTORY: Past Medical History  Diagnosis Date  . MS (multiple sclerosis) (HCC)   . Multiple sclerosis (HCC) 11/27/2012  . Optic neuritis, left   . Migraine headache     PAST SURGICAL HISTORY: Past Surgical History  Procedure Laterality Date  . Hernia repair    . Rotator cuff repair    . Knee surgery    . Hand tendon surgery      FAMILY HISTORY: Family History  Problem Relation Age of Onset  . Glaucoma Mother   . Diabetes Mother   . Stroke Father   . Heart attack Father   . Cancer - Prostate Father     SOCIAL HISTORY: Social History   Social History  . Marital Status: Divorced    Spouse Name: N/A  . Number of Children: 1  . Years of Education: BS    Occupational History  .  [employer]   Social History Main Topics  . Smoking status: Current Every Day Smoker -- 0.75 packs/day  . Smokeless tobacco: Never Used  . Alcohol Use: 0.0 oz/week    0  Standard drinks or equivalent per week     Comment: social  . Drug Use: No  . Sexual Activity: No   Other Topics Concern  . Not on file   Social History Narrative   Patient lives at home alone.    Patient has 1 child.    Patient is currently working.    Patient is right handed.    Patient has his BS degree.   Patient drinks 1-2 cups of caffeine daily.            PHYSICAL EXAM  Filed Vitals:   06/05/15 1511  BP: 116/78  Pulse: 72  Height:  (1.93 m)  Weight: 218 lb 9.6 oz (99.156 kg)   Body mass index is 26.62 kg/(m^2).  Generalized: Well  developed, in no acute distress   Neurological examination  Mentation: Alert oriented to time, place, history taking. Follows all commands speech and language fluent Cranial nerve II-XII: Pupils were equal round reactive to light. Extraocular movements were full, visual field were full on confrontational test. Facial sensation and strength were normal. Uvula tongue midline. Head turning and shoulder shrug  were normal and symmetric. Motor: The motor testing reveals 5 over 5 strength of all 4 extremities. Good symmetric motor tone is noted throughout.  Sensory: Sensory testing is intact to soft touch on all 4 extremities. No evidence of extinction is noted.  Coordination: Cerebellar testing reveals good finger-nose-finger and heel-to-shin bilaterally.  Gait and station: Gait is normal. Tandem gait is normal. Romberg is negative. No drift is seen.  Reflexes: Deep tendon reflexes are symmetric and normal bilaterally.   DIAGNOSTIC DATA (LABS, IMAGING, TESTING) - I reviewed patient records, labs, notes, testing and imaging myself where available.  Lab Results  Component Value Date   WBC 8.0 12/01/2014   HGB 14.2 11/30/2013   HCT 40.3 12/01/2014   MCV 90 12/01/2014   PLT 301 12/01/2014      Component Value Date/Time   NA 141 12/01/2014 1624   NA 139 02/09/2007 1130   K 4.1 12/01/2014 1624   CL 101 12/01/2014 1624   CO2 23 12/01/2014 1624   GLUCOSE 92 12/01/2014 1624   GLUCOSE 98 02/09/2007 1130   BUN 16 12/01/2014 1624   BUN 11 02/09/2007 1130   CREATININE 1.23 12/01/2014 1624   CALCIUM 9.3 12/01/2014 1624   PROT 6.9 12/01/2014 1624   PROT 7.1 02/09/2007 1130   ALBUMIN 4.3 12/01/2014 1624   ALBUMIN 4.3 02/09/2007 1130   AST 18 12/01/2014 1624   ALT 23 12/01/2014 1624   ALKPHOS 62 12/01/2014 1624   BILITOT 0.2 12/01/2014 1624   BILITOT 0.5 11/30/2013 1609   GFRNONAA 69 12/01/2014 1624   GFRAA 80 12/01/2014 1624      ASSESSMENT AND PLAN 50 y.o. year old male  has a past  medical history of MS (multiple sclerosis) (HCC); Multiple sclerosis (HCC) (11/27/2012); Optic neuritis, left; and Migraine headache. here with:  1. Multiple sclerosis  Overall the patient is doing well. He will continue on Gilenya. I will check blood work today. We will consider repeating an MRI at the next visit. Patient advised that if his symptoms worsen or he develops any new symptoms he will let us know. He will follow-up in 6 months or sooner if needed.     Butch Penny, MSN, NP-C 06/05/2015, 3:19 PM [facility] [address], [address] [place], [place] [address] [phone]

## 2015-06-05 NOTE — Patient Instructions (Signed)
Continue Gilenya Blood work today MRI at next visit If your symptoms worsen or you develop new symptoms please let us know.

## 2015-06-06 ENCOUNTER — Telehealth: Payer: Self-pay | Admitting: *Deleted

## 2015-06-06 LAB — COMPREHENSIVE METABOLIC PANEL
A/G RATIO: 1.8 (ref 1.2–2.2)
ALBUMIN: 4.6 g/dL (ref 3.5–5.5)
ALT: 25 IU/L (ref 0–44)
AST: 19 IU/L (ref 0–40)
Alkaline Phosphatase: 66 IU/L (ref 39–117)
BILIRUBIN TOTAL: 0.3 mg/dL (ref 0.0–1.2)
BUN/Creatinine Ratio: 14 (ref 9–20)
BUN: 14 mg/dL (ref 6–24)
CHLORIDE: 99 mmol/L (ref 96–106)
CO2: 23 mmol/L (ref 18–29)
Calcium: 9.3 mg/dL (ref 8.7–10.2)
Creatinine, Ser: 0.98 mg/dL (ref 0.76–1.27)
GFR calc Af Amer: 105 mL/min/{1.73_m2} (ref >59)
GFR, EST NON AFRICAN AMERICAN: 91 mL/min/{1.73_m2} (ref >59)
Globulin, Total: 2.6 g/dL (ref 1.5–4.5)
Glucose: 89 mg/dL (ref 65–99)
POTASSIUM: 4 mmol/L (ref 3.5–5.2)
SODIUM: 140 mmol/L (ref 134–144)
Total Protein: 7.2 g/dL (ref 6.0–8.5)

## 2015-06-06 LAB — CBC WITH DIFFERENTIAL/PLATELET
BASOS ABS: 0 10*3/uL (ref 0.0–0.2)
BASOS: 0 %
EOS (ABSOLUTE): 0.1 10*3/uL (ref 0.0–0.4)
Eos: 2 %
Hematocrit: 41 % (ref 37.5–51.0)
Hemoglobin: 13.9 g/dL (ref 12.6–17.7)
Immature Grans (Abs): 0 10*3/uL (ref 0.0–0.1)
Immature Granulocytes: 0 %
LYMPHS: 14 %
Lymphocytes Absolute: 0.9 10*3/uL (ref 0.7–3.1)
MCH: 30.3 pg (ref 26.6–33.0)
MCHC: 33.9 g/dL (ref 31.5–35.7)
MCV: 89 fL (ref 79–97)
MONOS ABS: 0.5 10*3/uL (ref 0.1–0.9)
Monocytes: 8 %
NEUTROS PCT: 76 %
Neutrophils Absolute: 4.9 10*3/uL (ref 1.4–7.0)
PLATELETS: 305 10*3/uL (ref 150–379)
RBC: 4.59 x10E6/uL (ref 4.14–5.80)
RDW: 15 % (ref 12.3–15.4)
WBC: 6.4 10*3/uL (ref 3.4–10.8)

## 2015-06-06 NOTE — Telephone Encounter (Signed)
Spoke with patient and informed him, per Dolores Hoose NP his lab results are normal. He verbalized understanding, appreciation.

## 2015-12-05 ENCOUNTER — Encounter: Payer: Self-pay | Admitting: Adult Health

## 2015-12-05 ENCOUNTER — Ambulatory Visit (INDEPENDENT_AMBULATORY_CARE_PROVIDER_SITE_OTHER): Payer: BLUE CROSS/BLUE SHIELD | Admitting: Adult Health

## 2015-12-05 VITALS — BP 125/83 | HR 76 | Wt 210.4 lb

## 2015-12-05 DIAGNOSIS — Z5181 Encounter for therapeutic drug level monitoring: Secondary | ICD-10-CM

## 2015-12-05 DIAGNOSIS — G35 Multiple sclerosis: Secondary | ICD-10-CM | POA: Diagnosis not present

## 2015-12-05 NOTE — Patient Instructions (Signed)
Continue Gilenya Blood work today MRI brain If your symptoms worsen or you develop new symptoms please let us know.   

## 2015-12-05 NOTE — Progress Notes (Signed)
I have read the note, and I agree with the clinical assessment and plan.  Joshua Boyer,Joshua Boyer   

## 2015-12-05 NOTE — Progress Notes (Signed)
PATIENT: Joshua Boyer DOB: Aug 05, 1965  REASON FOR VISIT: follow up- multiple sclerosis HISTORY FROM: patient  HISTORY OF PRESENT ILLNESS: Joshua Boyer is a 50 year old male with a history of multiple sclerosis. He returns today for follow-up. Overall he is doing well. Denies any new symptoms. Denies any changes with his gait or balance. Denies any new numbness or weakness. He does state that over the summer he had a "cold sensation" around his right ankle. However this has not occurred since then. No changes with his vision. No change in bowels or bladder. Overall he feels that he is doing well. He is considering trying homeopathic measures to treat his multiple sclerosis. He returns today for an evaluation.  HISTORY 06/05/15: Joshua Boyer is a 50 year old male with a history of multiple sclerosis. He returns today for follow-up. He continues to take Gilenya and tolerates it well. He denies any significant changes. He does report occasionally he'll have a tingling sensation in the left foot and toes but this sensation only last for seconds. He reports that is very rare. Denies changes with his gait or balance. Denies any changes with the bowels or bladder. No change in his vision. Denies any new numbness or weakness. He returns today for an evaluation.  HISTORY 12/01/14:Joshua Boyer is a 50 year old male with a history of multiple sclerosis. He returns today for follow-up. He continues to take Gilenya and tolerates it well. He denies any changes with his gait or balance. Denies any new numbness or weakness. Denies any changes with her vision. No changes with the bowels or bladder. Denies any significant fatigue. Overall he feels that he is doing relatively well. He returns today for an evaluation.  HISTORY 06/01/14 Joshua Boyer): Joshua Boyer is a 50 year old right-handed black male with a history of multiple sclerosis. The patient has had a prior left optic neuritis. Indicates that he has had no residual  symptoms from this episode. He is on Gilenya, he is tolerating the medication well. MRI of the brain done in November 2015 showed no progression from the prior study done in 2014. He last had blood work done in the fall of 2015. He reports no new symptoms of numbness, weakness, balance problems, visual changes, or bowel or bladder control issues. He returns to the office today for an evaluation. In general, he does not have issues with fatigue or lack of energy.    REVIEW OF SYSTEMS: Out of a complete 14 system review of symptoms, the patient complains only of the following symptoms, and all other reviewed systems are negative.  Numbness  ALLERGIES: No Known Allergies  HOME MEDICATIONS: Outpatient Medications Prior to Visit  Medication Sig Dispense Refill  . Cholecalciferol (VITAMIN D3) 1000 UNITS CAPS Take 1 capsule (1,000 Units total) by mouth daily.    . Fingolimod HCl (GILENYA) 0.5 MG CAPS Take 1 capsule (0.5 mg total) by mouth daily. 90 capsule 3   No facility-administered medications prior to visit.     PAST MEDICAL HISTORY: Past Medical History:  Diagnosis Date  . Migraine headache   . MS (multiple sclerosis) (HCC)   . Multiple sclerosis (HCC) 11/27/2012  . Optic neuritis, left     PAST SURGICAL HISTORY: Past Surgical History:  Procedure Laterality Date  . HAND TENDON SURGERY    . HERNIA REPAIR    . KNEE SURGERY    . ROTATOR CUFF REPAIR      FAMILY HISTORY: Family History  Problem Relation Age of Onset  . Glaucoma Mother   .  Diabetes Mother   . Stroke Father   . Heart attack Father   . Cancer - Prostate Father     SOCIAL HISTORY: Social History   Social History  . Marital status: Divorced    Spouse name: N/A  . Number of children: 1  . Years of education: BS    Occupational History  .  [employer]   Social History Main Topics  . Smoking status: Current Every Day Smoker    Packs/day: 0.75  . Smokeless tobacco: Never Used  . Alcohol use 0.0 oz/week      Comment: social  . Drug use: No  . Sexual activity: No   Other Topics Concern  . Not on file   Social History Narrative   Patient lives at home alone.    Patient has 1 child.    Patient is currently working.    Patient is right handed.    Patient has his BS degree.   Patient drinks 1-2 cups of caffeine daily.            PHYSICAL EXAM  Vitals:   12/05/15 1340  BP: 125/83  Pulse: 76  Weight: 210 lb 6.4 oz (95.4 kg)   Body mass index is 25.61 kg/m.  Generalized: Well developed, in no acute distress   Neurological examination  Mentation: Alert oriented to time, place, history taking. Follows all commands speech and language fluent Cranial nerve II-XII: Pupils were equal round reactive to light. Extraocular movements were full, visual field were full on confrontational test. Facial sensation and strength were normal. Uvula tongue midline. Head turning and shoulder shrug  were normal and symmetric. Motor: The motor testing reveals 5 over 5 strength of all 4 extremities. Good symmetric motor tone is noted throughout.  Sensory: Sensory testing is intact to soft touch on all 4 extremities. No evidence of extinction is noted.  Coordination: Cerebellar testing reveals good finger-nose-finger and heel-to-shin bilaterally.  Gait and station: Gait is normal. Tandem gait is normal. Romberg is negative. No drift is seen.  Reflexes: Deep tendon reflexes are symmetric and normal bilaterally.   DIAGNOSTIC DATA (LABS, IMAGING, TESTING) - I reviewed patient records, labs, notes, testing and imaging myself where available.  Lab Results  Component Value Date   WBC 6.4 06/05/2015   HGB 14.2 11/30/2013   HCT 41.0 06/05/2015   MCV 89 06/05/2015   PLT 305 06/05/2015      Component Value Date/Time   NA 140 06/05/2015 1557   K 4.0 06/05/2015 1557   CL 99 06/05/2015 1557   CO2 23 06/05/2015 1557   GLUCOSE 89 06/05/2015 1557   GLUCOSE 98 02/09/2007 1130   BUN 14 06/05/2015 1557    CREATININE 0.98 06/05/2015 1557   CALCIUM 9.3 06/05/2015 1557   PROT 7.2 06/05/2015 1557   ALBUMIN 4.6 06/05/2015 1557   AST 19 06/05/2015 1557   ALT 25 06/05/2015 1557   ALKPHOS 66 06/05/2015 1557   BILITOT 0.3 06/05/2015 1557   GFRNONAA 91 06/05/2015 1557   GFRAA 105 06/05/2015 1557      ASSESSMENT AND PLAN 50 y.o. year old male  has a past medical history of Migraine headache; MS (multiple sclerosis) (HCC); Multiple sclerosis (HCC) (11/27/2012); and Optic neuritis, left. here with:  1. Multiple sclerosis  The patient has remained stable. He will continue on Gilenya. I will check blood work today. We will repeat an MRI of the brain to look for progression (new lesions) of MS. advised the patient that I would  remain on Gilenya I do not advise stopping Gilenya to do homeopathic measures to treat his MS. He verbalized understanding. He will follow-up in 6 months with Dr. Anne HahnWillis.     Butch PennyMegan Saleemah Mollenhauer, MSN, NP-C 12/05/2015, 1:44 PM [facility] [address], [address] [place], [place] [address] [phone]

## 2015-12-06 ENCOUNTER — Ambulatory Visit: Payer: BLUE CROSS/BLUE SHIELD | Admitting: Adult Health

## 2015-12-06 ENCOUNTER — Telehealth: Payer: Self-pay | Admitting: *Deleted

## 2015-12-06 LAB — CBC WITH DIFFERENTIAL/PLATELET
BASOS ABS: 0 10*3/uL (ref 0.0–0.2)
BASOS: 0 %
EOS (ABSOLUTE): 0.1 10*3/uL (ref 0.0–0.4)
Eos: 2 %
Hematocrit: 40.6 % (ref 37.5–51.0)
Hemoglobin: 13.7 g/dL (ref 12.6–17.7)
IMMATURE GRANULOCYTES: 0 %
Immature Grans (Abs): 0 10*3/uL (ref 0.0–0.1)
LYMPHS: 13 %
Lymphocytes Absolute: 1.1 10*3/uL (ref 0.7–3.1)
MCH: 30.4 pg (ref 26.6–33.0)
MCHC: 33.7 g/dL (ref 31.5–35.7)
MCV: 90 fL (ref 79–97)
Monocytes Absolute: 0.9 10*3/uL (ref 0.1–0.9)
Monocytes: 11 %
NEUTROS PCT: 74 %
Neutrophils Absolute: 5.9 10*3/uL (ref 1.4–7.0)
PLATELETS: 317 10*3/uL (ref 150–379)
RBC: 4.5 x10E6/uL (ref 4.14–5.80)
RDW: 14.9 % (ref 12.3–15.4)
WBC: 7.9 10*3/uL (ref 3.4–10.8)

## 2015-12-06 LAB — COMPREHENSIVE METABOLIC PANEL
ALT: 20 IU/L (ref 0–44)
AST: 17 IU/L (ref 0–40)
Albumin/Globulin Ratio: 1.9 (ref 1.2–2.2)
Albumin: 4.7 g/dL (ref 3.5–5.5)
Alkaline Phosphatase: 74 IU/L (ref 39–117)
BUN/Creatinine Ratio: 15 (ref 9–20)
BUN: 16 mg/dL (ref 6–24)
Bilirubin Total: <0.2 mg/dL (ref 0.0–1.2)
CO2: 25 mmol/L (ref 18–29)
Calcium: 9.6 mg/dL (ref 8.7–10.2)
Chloride: 103 mmol/L (ref 96–106)
Creatinine, Ser: 1.08 mg/dL (ref 0.76–1.27)
GFR calc Af Amer: 93 mL/min/{1.73_m2} (ref >59)
GFR calc non Af Amer: 80 mL/min/{1.73_m2} (ref >59)
Globulin, Total: 2.5 g/dL (ref 1.5–4.5)
Glucose: 87 mg/dL (ref 65–99)
Potassium: 4.9 mmol/L (ref 3.5–5.2)
Sodium: 143 mmol/L (ref 134–144)
Total Protein: 7.2 g/dL (ref 6.0–8.5)

## 2015-12-06 NOTE — Telephone Encounter (Signed)
-----   Message from Megan Millikan, NP sent at 12/06/2015  2:14 PM EST ----- Lab work unremarkable. Please call patient. 

## 2015-12-06 NOTE — Telephone Encounter (Signed)
Spoke to pt and relayed lab work unremarkable. He verbalized understanding.

## 2015-12-12 ENCOUNTER — Other Ambulatory Visit: Payer: Self-pay | Admitting: Neurology

## 2016-06-06 ENCOUNTER — Encounter: Payer: Self-pay | Admitting: Neurology

## 2016-06-06 ENCOUNTER — Ambulatory Visit (INDEPENDENT_AMBULATORY_CARE_PROVIDER_SITE_OTHER): Payer: BLUE CROSS/BLUE SHIELD | Admitting: Neurology

## 2016-06-06 ENCOUNTER — Encounter (INDEPENDENT_AMBULATORY_CARE_PROVIDER_SITE_OTHER): Payer: Self-pay

## 2016-06-06 VITALS — BP 125/79 | HR 77 | Wt 201.0 lb

## 2016-06-06 DIAGNOSIS — Z5181 Encounter for therapeutic drug level monitoring: Secondary | ICD-10-CM | POA: Diagnosis not present

## 2016-06-06 DIAGNOSIS — H469 Unspecified optic neuritis: Secondary | ICD-10-CM | POA: Diagnosis not present

## 2016-06-06 DIAGNOSIS — G35 Multiple sclerosis: Secondary | ICD-10-CM

## 2016-06-06 NOTE — Progress Notes (Signed)
Reason for visit: Multiple sclerosis  Joshua Boyer is an 51 y.o. male  History of present illness:  Joshua Boyer is a 51 year old right-handed black male with a history of multiple sclerosis. The patient has been on Gilenya and he has done very well on this medication. His last MRI of the brain was done in 2015. He has not had any new symptoms since last seen, he denies any fatigue, numbness or weakness of the extremities or face, he denies any vision changes, he denies any difficulty controlling the bowels or the bladder. He has not had any balance issues. The patient denies any heat sensitivity. He is not having any side effects on the Gilenya. He returns for an evaluation.  Past Medical History:  Diagnosis Date  . Migraine headache   . MS (multiple sclerosis) (HCC)   . Multiple sclerosis (HCC) 11/27/2012  . Optic neuritis, left     Past Surgical History:  Procedure Laterality Date  . HAND TENDON SURGERY    . HERNIA REPAIR    . KNEE SURGERY    . ROTATOR CUFF REPAIR      Family History  Problem Relation Age of Onset  . Glaucoma Mother   . Diabetes Mother   . Stroke Father   . Heart attack Father   . Cancer - Prostate Father     Social history:  reports that he has been smoking.  He has been smoking about 0.75 packs per day. He has never used smokeless tobacco. He reports that he drinks alcohol. He reports that he does not use drugs.   No Known Allergies  Medications:  Prior to Admission medications   Medication Sig Start Date End Date Taking? Authorizing Provider  Cholecalciferol (VITAMIN D3) 1000 UNITS CAPS Take 1 capsule (1,000 Units total) by mouth daily. 11/27/12  Yes York Spaniel, MD  GILENYA 0.5 MG CAPS TAKE ONE CAPSULE BY MOUTH DAILY. AS DIRECTED ([place]) 12/12/15  Yes York Spaniel, MD    ROS:  Out of a complete 14 system review of symptoms, the patient complains only of the following symptoms, and all other reviewed systems are negative.  Review  of systems is negative  Blood pressure 125/79, pulse 77, weight 201 lb (91.2 kg).  Physical Exam  General: The patient is alert and cooperative at the time of the examination.  Skin: No significant peripheral edema is noted.   Neurologic Exam  Mental status: The patient is alert and oriented x 3 at the time of the examination. The patient has apparent normal recent and remote memory, with an apparently normal attention span and concentration ability.   Cranial nerves: Facial symmetry is present. Speech is normal, no aphasia or dysarthria is noted. Extraocular movements are full. Visual fields are full. Pupils are equal, round, and reactive to light. Discs are flat bilaterally.  Motor: The patient has good strength in all 4 extremities.  Sensory examination: Soft touch sensation is symmetric on the face, arms, and legs.  Coordination: The patient has good finger-nose-finger and heel-to-shin bilaterally.  Gait and station: The patient has a normal gait. Tandem gait is normal. Romberg is negative. No drift is seen.  Reflexes: Deep tendon reflexes are symmetric.   Assessment/Plan:  1. Multiple sclerosis  The patient is doing quite well at this time on Gilenya, we will check blood work today, he will follow-up in 6 months. We should check another MRI study on his next visit. The patient will call if any  concerns arise.  Marlan Palau MD 06/06/2016 1:49 PM  [facility] [address] [place], [place] [address]  Phone [phone] Fax [phone]

## 2016-06-07 LAB — CBC WITH DIFFERENTIAL/PLATELET
Basophils Absolute: 0 10*3/uL (ref 0.0–0.2)
Basos: 0 %
EOS (ABSOLUTE): 0.1 10*3/uL (ref 0.0–0.4)
EOS: 1 %
HEMATOCRIT: 40.5 % (ref 37.5–51.0)
Hemoglobin: 13.5 g/dL (ref 13.0–17.7)
Immature Grans (Abs): 0 10*3/uL (ref 0.0–0.1)
Immature Granulocytes: 0 %
LYMPHS ABS: 0.9 10*3/uL (ref 0.7–3.1)
Lymphs: 10 %
MCH: 30.6 pg (ref 26.6–33.0)
MCHC: 33.3 g/dL (ref 31.5–35.7)
MCV: 92 fL (ref 79–97)
MONOS ABS: 1.2 10*3/uL — AB (ref 0.1–0.9)
Monocytes: 13 %
Neutrophils Absolute: 7.2 10*3/uL — ABNORMAL HIGH (ref 1.4–7.0)
Neutrophils: 76 %
Platelets: 317 10*3/uL (ref 150–379)
RBC: 4.41 x10E6/uL (ref 4.14–5.80)
RDW: 15.2 % (ref 12.3–15.4)
WBC: 9.4 10*3/uL (ref 3.4–10.8)

## 2016-06-07 LAB — COMPREHENSIVE METABOLIC PANEL
ALBUMIN: 4.7 g/dL (ref 3.5–5.5)
ALK PHOS: 70 IU/L (ref 39–117)
ALT: 21 IU/L (ref 0–44)
AST: 15 IU/L (ref 0–40)
Albumin/Globulin Ratio: 1.8 (ref 1.2–2.2)
BILIRUBIN TOTAL: 0.4 mg/dL (ref 0.0–1.2)
BUN / CREAT RATIO: 13 (ref 9–20)
BUN: 16 mg/dL (ref 6–24)
CHLORIDE: 102 mmol/L (ref 96–106)
CO2: 25 mmol/L (ref 18–29)
CREATININE: 1.21 mg/dL (ref 0.76–1.27)
Calcium: 10 mg/dL (ref 8.7–10.2)
GFR calc Af Amer: 81 mL/min/{1.73_m2} (ref >59)
GFR calc non Af Amer: 70 mL/min/{1.73_m2} (ref >59)
GLOBULIN, TOTAL: 2.6 g/dL (ref 1.5–4.5)
Glucose: 93 mg/dL (ref 65–99)
Potassium: 4.7 mmol/L (ref 3.5–5.2)
SODIUM: 141 mmol/L (ref 134–144)
Total Protein: 7.3 g/dL (ref 6.0–8.5)

## 2016-06-11 ENCOUNTER — Telehealth: Payer: Self-pay | Admitting: *Deleted

## 2016-06-11 NOTE — Telephone Encounter (Signed)
Advised patient of unremarkable labs per previous message. °

## 2016-06-11 NOTE — Telephone Encounter (Signed)
Tried calling pt about labs. VM full, unable to LVM.   *ok to inform patient labs unremarkable per CW,MD if he calls

## 2016-06-11 NOTE — Telephone Encounter (Signed)
-----   Message from York Spaniel, MD sent at 06/08/2016  7:30 AM EDT -----   The blood work results are unremarkable. Please call the patient.  ----- Message ----- From: Nell Range Lab Results In Sent: 06/07/2016   5:41 AM To: York Spaniel, MD

## 2016-06-11 NOTE — Telephone Encounter (Signed)
Noted, thank you

## 2016-10-28 NOTE — Telephone Encounter (Signed)
Closing Encounter.

## 2016-11-12 ENCOUNTER — Ambulatory Visit (INDEPENDENT_AMBULATORY_CARE_PROVIDER_SITE_OTHER): Payer: BLUE CROSS/BLUE SHIELD | Admitting: Adult Health

## 2016-11-12 ENCOUNTER — Encounter: Payer: Self-pay | Admitting: Adult Health

## 2016-11-12 VITALS — BP 120/75 | HR 76 | Wt 193.0 lb

## 2016-11-12 DIAGNOSIS — G35D Multiple sclerosis, unspecified: Secondary | ICD-10-CM

## 2016-11-12 DIAGNOSIS — Z5181 Encounter for therapeutic drug level monitoring: Secondary | ICD-10-CM

## 2016-11-12 DIAGNOSIS — G35 Multiple sclerosis: Secondary | ICD-10-CM | POA: Diagnosis not present

## 2016-11-12 NOTE — Progress Notes (Signed)
PATIENT: Joshua Boyer DOB: 1965-09-06  REASON FOR VISIT: follow up HISTORY FROM: patient  HISTORY OF PRESENT ILLNESS: Today 11/12/16  Mr. Joshua Boyer is a 51 year old male with a history of multiple sources. He returns today for follow-up. He remains on Gilenya and is tolerating it well. He denies any new neurological symptoms. Denies any changes with his gait or balance. Denies any new numbness or weakness. Denies any changes with his vision. No change in the bowels or bladder. He remains on Gilenya and is tolerating it well. He returns today for an evaluation.  HISTORY 06/06/16: Mr. Joshua Boyer is a 51 year old right-handed black male with a history of multiple sclerosis. The patient has been on Gilenya and he has done very well on this medication. His last MRI of the brain was done in 2015. He has not had any new symptoms since last seen, he denies any fatigue, numbness or weakness of the extremities or face, he denies any vision changes, he denies any difficulty controlling the bowels or the bladder. He has not had any balance issues. The patient denies any heat sensitivity. He is not having any side effects on the Gilenya. He returns for an evaluation.   REVIEW OF SYSTEMS: Out of a complete 14 system review of symptoms, the patient complains only of the following symptoms, and all other reviewed systems are negative.  ALLERGIES: No Known Allergies  HOME MEDICATIONS: Outpatient Medications Prior to Visit  Medication Sig Dispense Refill  . Cholecalciferol (VITAMIN D3) 1000 UNITS CAPS Take 1 capsule (1,000 Units total) by mouth daily.    Marland Kitchen. GILENYA 0.5 MG CAPS TAKE ONE CAPSULE BY MOUTH DAILY. AS DIRECTED ([place]) 90 capsule 3   No facility-administered medications prior to visit.     PAST MEDICAL HISTORY: Past Medical History:  Diagnosis Date  . Migraine headache   . MS (multiple sclerosis) (HCC)   . Multiple sclerosis (HCC) 11/27/2012  . Optic neuritis, left     PAST SURGICAL  HISTORY: Past Surgical History:  Procedure Laterality Date  . HAND TENDON SURGERY    . HERNIA REPAIR    . KNEE SURGERY    . ROTATOR CUFF REPAIR      FAMILY HISTORY: Family History  Problem Relation Age of Onset  . Glaucoma Mother   . Diabetes Mother   . Stroke Father   . Heart attack Father   . Cancer - Prostate Father     SOCIAL HISTORY: Social History   Social History  . Marital status: Divorced    Spouse name: N/A  . Number of children: 1  . Years of education: BS    Occupational History  .  [employer]   Social History Main Topics  . Smoking status: Current Every Day Smoker    Packs/day: 0.75  . Smokeless tobacco: Never Used  . Alcohol use 0.0 oz/week     Comment: social  . Drug use: No  . Sexual activity: No   Other Topics Concern  . Not on file   Social History Narrative   Patient lives at home alone.    Patient has 1 child.    Patient is currently working.    Patient is right handed.    Patient has his BS degree.   Patient drinks 1-2 cups of caffeine daily.            PHYSICAL EXAM  There were no vitals filed for this visit. There is no height or weight on file to calculate BMI.  Generalized: Well developed, in no acute distress   Neurological examination  Mentation: Alert oriented to time, place, history taking. Follows all commands speech and language fluent Cranial nerve II-XII: Pupils were equal round reactive to light. Extraocular movements were full, visual field were full on confrontational test. Facial sensation and strength were normal. Uvula tongue midline. Head turning and shoulder shrug  were normal and symmetric. Motor: The motor testing reveals 5 over 5 strength of all 4 extremities. Good symmetric motor tone is noted throughout.  Sensory: Sensory testing is intact to soft touch on all 4 extremities. No evidence of extinction is noted.  Coordination: Cerebellar testing reveals good finger-nose-finger and heel-to-shin bilaterally.  Gait  and station: Gait is normal. Tandem gait is normal. Romberg is negative. No drift is seen.  Reflexes: Deep tendon reflexes are symmetric and normal bilaterally.   DIAGNOSTIC DATA (LABS, IMAGING, TESTING) - I reviewed patient records, labs, notes, testing and imaging myself where available.  Lab Results  Component Value Date   WBC 9.4 06/06/2016   HGB 13.5 06/06/2016   HCT 40.5 06/06/2016   MCV 92 06/06/2016   PLT 317 06/06/2016      Component Value Date/Time   NA 141 06/06/2016 1406   K 4.7 06/06/2016 1406   CL 102 06/06/2016 1406   CO2 25 06/06/2016 1406   GLUCOSE 93 06/06/2016 1406   GLUCOSE 98 02/09/2007 1130   BUN 16 06/06/2016 1406   CREATININE 1.21 06/06/2016 1406   CALCIUM 10.0 06/06/2016 1406   PROT 7.3 06/06/2016 1406   ALBUMIN 4.7 06/06/2016 1406   AST 15 06/06/2016 1406   ALT 21 06/06/2016 1406   ALKPHOS 70 06/06/2016 1406   BILITOT 0.4 06/06/2016 1406   GFRNONAA 70 06/06/2016 1406   GFRAA 81 06/06/2016 1406      ASSESSMENT AND PLAN 51 y.o. year old male  has a past medical history of Migraine headache; MS (multiple sclerosis) (HCC); Multiple sclerosis (HCC) (11/27/2012); and Optic neuritis, left. here with :  1. Multiple sclerosis  Overall the patient is doing well. He will continue on Gilenya. I will check blood work today. I recommended an MRI of the brain however the patient states that he will call at the beginning of the year to schedule this. I will put the order in once he calls. He is advised that if his symptoms worsen or he develops new symptoms he should let us know.  I spent 15 minutes with the patient. 50% of this time was spent discussing his treatment plan.    Butch Penny, MSN, NP-C 11/12/2016, 1:13 PM [facility] [address] [place], [place] [address] [phone]

## 2016-11-12 NOTE — Patient Instructions (Addendum)
Your Plan:  Continue Gilenya Blood work today Call to schedule MRI when ready If your symptoms worsen or you develop new symptoms please let us know.   Thank you for coming to see [place] at [facility]. I hope we have been able to provide you high quality care today.  You may receive a patient satisfaction survey over the next few weeks. We would appreciate your feedback and comments so that we may continue to improve ourselves and the health of our patients.

## 2016-11-12 NOTE — Progress Notes (Signed)
I have read the note, and I agree with the clinical assessment and plan.  WILLIS,CHARLES KEITH   

## 2016-11-13 ENCOUNTER — Telehealth: Payer: Self-pay | Admitting: *Deleted

## 2016-11-13 LAB — COMPREHENSIVE METABOLIC PANEL
A/G RATIO: 1.9 (ref 1.2–2.2)
ALBUMIN: 4.7 g/dL (ref 3.5–5.5)
ALK PHOS: 68 IU/L (ref 39–117)
ALT: 14 IU/L (ref 0–44)
AST: 17 IU/L (ref 0–40)
BUN/Creatinine Ratio: 20 (ref 9–20)
BUN: 22 mg/dL (ref 6–24)
Bilirubin Total: 0.2 mg/dL (ref 0.0–1.2)
CO2: 23 mmol/L (ref 20–29)
CREATININE: 1.08 mg/dL (ref 0.76–1.27)
Calcium: 9.8 mg/dL (ref 8.7–10.2)
Chloride: 102 mmol/L (ref 96–106)
GFR calc Af Amer: 92 mL/min/{1.73_m2} (ref >59)
GFR calc non Af Amer: 80 mL/min/{1.73_m2} (ref >59)
GLOBULIN, TOTAL: 2.5 g/dL (ref 1.5–4.5)
Glucose: 88 mg/dL (ref 65–99)
POTASSIUM: 4.4 mmol/L (ref 3.5–5.2)
SODIUM: 143 mmol/L (ref 134–144)
Total Protein: 7.2 g/dL (ref 6.0–8.5)

## 2016-11-13 LAB — CBC WITH DIFFERENTIAL/PLATELET
Basophils Absolute: 0 10*3/uL (ref 0.0–0.2)
Basos: 0 %
EOS (ABSOLUTE): 0.2 10*3/uL (ref 0.0–0.4)
EOS: 2 %
Hematocrit: 39.7 % (ref 37.5–51.0)
Hemoglobin: 13.7 g/dL (ref 13.0–17.7)
Immature Grans (Abs): 0 10*3/uL (ref 0.0–0.1)
Immature Granulocytes: 0 %
Lymphocytes Absolute: 1.2 10*3/uL (ref 0.7–3.1)
Lymphs: 13 %
MCH: 30.7 pg (ref 26.6–33.0)
MCHC: 34.5 g/dL (ref 31.5–35.7)
MCV: 89 fL (ref 79–97)
MONOS ABS: 0.8 10*3/uL (ref 0.1–0.9)
Monocytes: 9 %
Neutrophils Absolute: 7.5 10*3/uL — ABNORMAL HIGH (ref 1.4–7.0)
Neutrophils: 76 %
PLATELETS: 300 10*3/uL (ref 150–379)
RBC: 4.46 x10E6/uL (ref 4.14–5.80)
RDW: 15 % (ref 12.3–15.4)
WBC: 9.7 10*3/uL (ref 3.4–10.8)

## 2016-11-13 NOTE — Telephone Encounter (Signed)
LVM informing patient his lab work is unremarkable. Left number for any questions.

## 2016-12-09 ENCOUNTER — Ambulatory Visit: Payer: BLUE CROSS/BLUE SHIELD | Admitting: Adult Health

## 2016-12-19 ENCOUNTER — Other Ambulatory Visit: Payer: Self-pay | Admitting: Neurology

## 2016-12-26 ENCOUNTER — Telehealth: Payer: Self-pay | Admitting: *Deleted

## 2016-12-26 NOTE — Telephone Encounter (Signed)
Faxed completed/signed PA Gilenya to [employer] at [phone]. Received fax confirmation. Awaiting on determination from insurance.

## 2016-12-31 NOTE — Telephone Encounter (Signed)
Received notice of approval from [employer] via mail for PA Gilenya. Approved effective 12/26/16-12/27/2018.  PA# [id number]

## 2017-05-12 ENCOUNTER — Ambulatory Visit: Payer: BLUE CROSS/BLUE SHIELD | Admitting: Neurology

## 2017-07-10 ENCOUNTER — Encounter: Payer: Self-pay | Admitting: Neurology

## 2017-07-10 ENCOUNTER — Ambulatory Visit (INDEPENDENT_AMBULATORY_CARE_PROVIDER_SITE_OTHER): Payer: BLUE CROSS/BLUE SHIELD | Admitting: Neurology

## 2017-07-10 ENCOUNTER — Telehealth: Payer: Self-pay | Admitting: Neurology

## 2017-07-10 ENCOUNTER — Other Ambulatory Visit: Payer: Self-pay

## 2017-07-10 VITALS — BP 130/93 | HR 92 | Ht 76.0 in | Wt 188.5 lb

## 2017-07-10 DIAGNOSIS — Z5181 Encounter for therapeutic drug level monitoring: Secondary | ICD-10-CM

## 2017-07-10 DIAGNOSIS — G35 Multiple sclerosis: Secondary | ICD-10-CM | POA: Diagnosis not present

## 2017-07-10 NOTE — Progress Notes (Signed)
Reason for visit: Multiple sclerosis  Joshua Boyer is an 52 y.o. male  History of present illness:  Joshua Boyer is a 52 year old right-handed black male with a history of multiple sclerosis.  The patient has been on Gilenya, he is tolerating this medication quite well.  He has not had any new symptoms of numbness, weakness, vision changes, balance alteration, or difficulty controlling the bowels or the bladder.  He feels quite well on the Gilenya.  The has gone about 4 years without MRI evaluation, he does not wish to have MRI as he claims that he cannot afford the $500 co-pay.  The MRI has been ordered on several occasions.  The patient returns to the office today for an evaluation.  Past Medical History:  Diagnosis Date  . Migraine headache   . MS (multiple sclerosis) (HCC)   . Multiple sclerosis (HCC) 11/27/2012  . Optic neuritis, left     Past Surgical History:  Procedure Laterality Date  . HAND TENDON SURGERY    . HERNIA REPAIR    . KNEE SURGERY    . ROTATOR CUFF REPAIR      Family History  Problem Relation Age of Onset  . Glaucoma Mother   . Diabetes Mother   . Stroke Father   . Heart attack Father   . Cancer - Prostate Father     Social history:  reports that he has been smoking.  He has been smoking about 0.75 packs per day. He has never used smokeless tobacco. He reports that he drinks alcohol. He reports that he does not use drugs.   No Known Allergies  Medications:  Prior to Admission medications   Medication Sig Start Date End Date Taking? Authorizing Provider  Cholecalciferol (VITAMIN D3) 1000 UNITS CAPS Take 1 capsule (1,000 Units total) by mouth daily. 11/27/12  Yes York Spaniel, MD  GILENYA 0.5 MG CAPS TAKE ONE CAPSULE (0.5 MG) BY MOUTH ONCE DAILY. MAY TAKE WITH OR WITHOUT FOOD. STORE AT ROOM TEMPERATURE. 12/19/16  Yes Butch Penny, NP    ROS:  Out of a complete 14 system review of symptoms, the patient complains only of the following  symptoms, and all other reviewed systems are negative.  Review of systems is negative  Blood pressure (!) 130/93, pulse 92, height 6\' 4"  (1.93 m), weight 188 lb 8 oz (85.5 kg).  Physical Exam  General: The patient is alert and cooperative at the time of the examination.  Skin: No significant peripheral edema is noted.   Neurologic Exam  Mental status: The patient is alert and oriented x 3 at the time of the examination. The patient has apparent normal recent and remote memory, with an apparently normal attention span and concentration ability.   Cranial nerves: Facial symmetry is present. Speech is normal, no aphasia or dysarthria is noted. Extraocular movements are full. Visual fields are full.  Pupils are equal, round, and reactive to light.  Discs are flat bilaterally.  Motor: The patient has good strength in all 4 extremities.  Sensory examination: Soft touch sensation is symmetric on the face, arms, and legs.  Coordination: The patient has good finger-nose-finger and heel-to-shin bilaterally.  Gait and station: The patient has a normal gait. Tandem gait is normal. Romberg is negative. No drift is seen.  Reflexes: Deep tendon reflexes are symmetric.   Assessment/Plan:  1.  Multiple sclerosis  The patient appears to be doing quite well on the Gilenya, we will continue this therapy.  He  will have blood work today.  He does not wish to go on to have MRI evaluation for financial reasons.  He will follow-up in 6 months.  Marlan Palau MD 07/10/2017 9:36 AM  [facility] [address] [place], [place] [address]  Phone [phone] Fax [phone]

## 2017-07-10 NOTE — Telephone Encounter (Signed)
This patient did not show for a revisit appointment today. 

## 2017-07-11 ENCOUNTER — Telehealth: Payer: Self-pay | Admitting: *Deleted

## 2017-07-11 LAB — CBC WITH DIFFERENTIAL/PLATELET
Basophils Absolute: 0 10*3/uL (ref 0.0–0.2)
Basos: 0 %
EOS (ABSOLUTE): 0.2 10*3/uL (ref 0.0–0.4)
EOS: 2 %
HEMATOCRIT: 43.4 % (ref 37.5–51.0)
Hemoglobin: 14.3 g/dL (ref 13.0–17.7)
IMMATURE GRANULOCYTES: 0 %
Immature Grans (Abs): 0 10*3/uL (ref 0.0–0.1)
LYMPHS ABS: 1 10*3/uL (ref 0.7–3.1)
Lymphs: 11 %
MCH: 30.7 pg (ref 26.6–33.0)
MCHC: 32.9 g/dL (ref 31.5–35.7)
MCV: 93 fL (ref 79–97)
Monocytes Absolute: 0.8 10*3/uL (ref 0.1–0.9)
Monocytes: 9 %
NEUTROS PCT: 78 %
Neutrophils Absolute: 7.3 10*3/uL — ABNORMAL HIGH (ref 1.4–7.0)
PLATELETS: 304 10*3/uL (ref 150–450)
RBC: 4.66 x10E6/uL (ref 4.14–5.80)
RDW: 14.1 % (ref 12.3–15.4)
WBC: 9.3 10*3/uL (ref 3.4–10.8)

## 2017-07-11 LAB — COMPREHENSIVE METABOLIC PANEL
ALK PHOS: 64 IU/L (ref 39–117)
ALT: 13 IU/L (ref 0–44)
AST: 15 IU/L (ref 0–40)
Albumin/Globulin Ratio: 1.9 (ref 1.2–2.2)
Albumin: 4.6 g/dL (ref 3.5–5.5)
BUN / CREAT RATIO: 20 (ref 9–20)
BUN: 22 mg/dL (ref 6–24)
Bilirubin Total: 0.5 mg/dL (ref 0.0–1.2)
CALCIUM: 9.8 mg/dL (ref 8.7–10.2)
CHLORIDE: 104 mmol/L (ref 96–106)
CO2: 25 mmol/L (ref 20–29)
CREATININE: 1.09 mg/dL (ref 0.76–1.27)
GFR calc Af Amer: 91 mL/min/{1.73_m2} (ref >59)
GFR calc non Af Amer: 79 mL/min/{1.73_m2} (ref >59)
GLOBULIN, TOTAL: 2.4 g/dL (ref 1.5–4.5)
GLUCOSE: 109 mg/dL — AB (ref 65–99)
Potassium: 4.7 mmol/L (ref 3.5–5.2)
SODIUM: 142 mmol/L (ref 134–144)
Total Protein: 7 g/dL (ref 6.0–8.5)

## 2017-07-11 NOTE — Telephone Encounter (Signed)
-----   Message from York Spaniel, MD sent at 07/10/2017  6:45 PM EDT ----- I forgot to set this patient up for a RV, he will need one in 6 months, and he can see NP. Sorry, thanks.

## 2017-07-11 NOTE — Telephone Encounter (Signed)
Left patient a message requesting him to call our office to schedule a six month follow up with [facility].  Please help him find and appt when he calls back.

## 2017-07-11 NOTE — Telephone Encounter (Signed)
Spoke to patient and he was schedule with Megan on 02/04/2018.

## 2017-08-12 ENCOUNTER — Ambulatory Visit (INDEPENDENT_AMBULATORY_CARE_PROVIDER_SITE_OTHER): Payer: BLUE CROSS/BLUE SHIELD | Admitting: Orthopaedic Surgery

## 2017-08-12 ENCOUNTER — Ambulatory Visit (INDEPENDENT_AMBULATORY_CARE_PROVIDER_SITE_OTHER): Payer: BLUE CROSS/BLUE SHIELD

## 2017-08-12 ENCOUNTER — Encounter (INDEPENDENT_AMBULATORY_CARE_PROVIDER_SITE_OTHER): Payer: Self-pay | Admitting: Orthopaedic Surgery

## 2017-08-12 VITALS — BP 135/91 | HR 119 | Ht 76.0 in | Wt 185.0 lb

## 2017-08-12 DIAGNOSIS — M79602 Pain in left arm: Secondary | ICD-10-CM

## 2017-08-12 DIAGNOSIS — M25512 Pain in left shoulder: Secondary | ICD-10-CM | POA: Diagnosis not present

## 2017-08-12 DIAGNOSIS — M7542 Impingement syndrome of left shoulder: Secondary | ICD-10-CM

## 2017-08-12 NOTE — Progress Notes (Signed)
Office Visit Note   Patient: Joshua Boyer           Date of Birth: 24-Jun-1965           MRN: [id number] Visit Date: 08/12/2017              Requested by: Lavada Mesi, MD [address] [place], [place] [address] PCP: Lavada Mesi, MD   Assessment & Plan: Visit Diagnoses:  1. Left arm pain   2. Impingement syndrome of left shoulder   3. Acute pain of left shoulder     Plan: To help sort out patient's pain offered conservative management with injection.  Patient sent left shoulder was prepped with Betadine and subacromial Marcaine/Depo-Medrol injection was performed from a posterolateral approach.  Tolerated procedure well without complication.  Follow-up in 2 weeks for recheck.  Follow-Up Instructions: Return in about 2 weeks (around 08/25/2017) for with hilts as scheduled. .   Orders:  Orders Placed This Encounter  Procedures  . Large Joint Inj: L subacromial bursa  . XR Shoulder Left  . XR Cervical Spine 2 or 3 views   No orders of the defined types were placed in this encounter.     Procedures: Large Joint Inj: L subacromial bursa on 08/12/2017 4:23 PM Indications: pain Details: 25 G 1.5 in needle, posterior approach Medications: 2 mL lidocaine 1 %; 40 mg methylPREDNISolone acetate 40 MG/ML; 4 mL bupivacaine 0.25 % Consent was given by the patient.       Clinical Data: No additional findings.   Subjective: Chief Complaint  Patient presents with  . Neck - Pain  . Left Shoulder - Pain    HPI 52 year old black male with past medical history significant for MS presents with complaints of neck pain and left shoulder pain.  Left shoulder pain ongoing for several weeks.  No injury.  Occasionally has some left arm pain.  He is followed by neurologist Dr. Anne Hahn for his MS.  Left shoulder pain with overhead activity and reaching on his back.  Pain when he lies on his left side at night. Review of Systems No current cardiac pulmonary GI GU  issues  Objective: Vital Signs: BP (!) 135/91   Pulse (!) 119   Ht 6\' 4"  (1.93 m)   Wt 185 lb (83.9 kg)   BMI 22.52 kg/m   Physical Exam  Constitutional: He is oriented to person, place, and time. No distress.  HENT:  Head: Normocephalic and atraumatic.  Eyes: Pupils are equal, round, and reactive to light. EOM are normal.  Neck: Normal range of motion.  Mild brachial plexus tenderness.  Pulmonary/Chest: No respiratory distress.  Musculoskeletal:  Left shoulder good range of motion but with discomfort.  Positive impingement test.  Negative drop arm.  Neurological: He is alert and oriented to person, place, and time.  Skin: Skin is warm and dry.    Ortho Exam  Specialty Comments:  No specialty comments available.  Imaging: No results found.   PMFS History: Patient Active Problem List   Diagnosis Date Noted  . Multiple sclerosis (HCC) 11/27/2012  . Optic neuritis 11/27/2012   Past Medical History:  Diagnosis Date  . Migraine headache   . MS (multiple sclerosis) (HCC)   . Multiple sclerosis (HCC) 11/27/2012  . Optic neuritis, left     Family History  Problem Relation Age of Onset  . Glaucoma Mother   . Diabetes Mother   . Stroke Father   . Heart attack Father   .  Cancer - Prostate Father     Past Surgical History:  Procedure Laterality Date  . HAND TENDON SURGERY    . HERNIA REPAIR    . KNEE SURGERY    . ROTATOR CUFF REPAIR     Social History   Occupational History    Employer: [employer]  Tobacco Use  . Smoking status: Current Every Day Smoker    Packs/day: 0.75  . Smokeless tobacco: Never Used  Substance and Sexual Activity  . Alcohol use: Yes    Alcohol/week: 0.0 standard drinks    Comment: social  . Drug use: No  . Sexual activity: Never

## 2017-08-25 ENCOUNTER — Ambulatory Visit (INDEPENDENT_AMBULATORY_CARE_PROVIDER_SITE_OTHER): Payer: BLUE CROSS/BLUE SHIELD | Admitting: Family Medicine

## 2017-08-25 ENCOUNTER — Encounter (INDEPENDENT_AMBULATORY_CARE_PROVIDER_SITE_OTHER): Payer: Self-pay | Admitting: Family Medicine

## 2017-08-25 DIAGNOSIS — M25512 Pain in left shoulder: Secondary | ICD-10-CM

## 2017-08-25 NOTE — Progress Notes (Signed)
   Office Visit Note   Patient: Joshua Boyer           Date of Birth: [birth year]           MRN: [id number] Visit Date: 08/25/2017              Requested by: Lavada Mesi, MD [address] [place], [place] [address] PCP: Lavada Mesi, MD  S: He continues to complain of left shoulder pain.  It has been a few weeks since his initial injury.  Cortisone injection did not help.  Pain feels almost identical to when he tore his right rotator cuff.  He cannot sleep at night because of his pain, he cannot work because of his pain.  O: Decreased overhead reach.  Extreme pain with empty can test but still good strength.  Pain also with external rotation against resistance.  Tender at the posterior subacromial space and also at the [facility] joint.  Assessment & Plan: Visit Diagnoses:  1. Acute pain of left shoulder   -Exam and symptoms concerning for rotator cuff tear.  Plan: Out of work pending MRI scan.  Surgical consult if indicated.  Follow-Up Instructions: No follow-ups on file.   Orders:  Orders Placed This Encounter  Procedures  . MR Shoulder Left w/o contrast   No orders of the defined types were placed in this encounter.     Procedures: No procedures performed   Clinical Data: No additional findings.   Subjective: Chief Complaint  Patient presents with  . Left Shoulder - Pain    S/p cortisone injection 08/12/17 Zonia Kief, [place])  . Lower Back - Pain    HPI  Review of Systems   Objective: Vital Signs: There were no vitals taken for this visit.  Physical Exam  Ortho Exam  Specialty Comments:  No specialty comments available.  Imaging: No results found.   PMFS History: Patient Active Problem List   Diagnosis Date Noted  . Multiple sclerosis (HCC) 11/27/2012  . Optic neuritis 11/27/2012   Past Medical History:  Diagnosis Date  . Migraine headache   . MS (multiple sclerosis) (HCC)   . Multiple sclerosis (HCC) 11/27/2012  . Optic neuritis, left       Family History  Problem Relation Age of Onset  . Glaucoma Mother   . Diabetes Mother   . Stroke Father   . Heart attack Father   . Cancer - Prostate Father     Past Surgical History:  Procedure Laterality Date  . HAND TENDON SURGERY    . HERNIA REPAIR    . KNEE SURGERY    . ROTATOR CUFF REPAIR     Social History   Occupational History    Employer: [employer]  Tobacco Use  . Smoking status: Current Every Day Smoker    Packs/day: 0.75  . Smokeless tobacco: Never Used  Substance and Sexual Activity  . Alcohol use: Yes    Alcohol/week: 0.0 standard drinks    Comment: social  . Drug use: No  . Sexual activity: Never

## 2017-08-26 ENCOUNTER — Telehealth (INDEPENDENT_AMBULATORY_CARE_PROVIDER_SITE_OTHER): Payer: Self-pay | Admitting: Family Medicine

## 2017-08-26 ENCOUNTER — Encounter (INDEPENDENT_AMBULATORY_CARE_PROVIDER_SITE_OTHER): Payer: Self-pay | Admitting: Family Medicine

## 2017-08-26 NOTE — Telephone Encounter (Signed)
Patient will be coming in around 2:30 for [place] to fill in the missing parts on the form.

## 2017-08-26 NOTE — Telephone Encounter (Signed)
Patient called stating that Dr. Prince Rome filled a form for him yesterday but he had left some lines blank and wanted to know if he could bring the form back by this afternoon to have Dr. Prince Rome fill the rest of the form out.CB#[phone].  Thank you.

## 2017-08-29 MED ORDER — TRAMADOL HCL 50 MG PO TABS: 50.0000 mg | ORAL_TABLET | Freq: Four times a day (QID) | ORAL | 0 refills | Status: DC | PRN

## 2017-09-02 MED ORDER — METHYLPREDNISOLONE ACETATE 40 MG/ML IJ SUSP
40.0000 mg | INTRAMUSCULAR | Status: AC | PRN
  Administered 2017-08-12: 40 mg via INTRA_ARTICULAR

## 2017-09-02 MED ORDER — BUPIVACAINE HCL 0.25 % IJ SOLN
4.0000 mL | INTRAMUSCULAR | Status: AC | PRN
  Administered 2017-08-12: 4 mL via INTRA_ARTICULAR

## 2017-09-02 MED ORDER — LIDOCAINE HCL 1 % IJ SOLN
2.0000 mL | INTRAMUSCULAR | Status: AC | PRN
  Administered 2017-08-12: 2 mL

## 2017-09-06 ENCOUNTER — Ambulatory Visit (HOSPITAL_COMMUNITY)
Admission: RE | Admit: 2017-09-06 | Discharge: 2017-09-06 | Disposition: A | Discharge status: Home / Self Care | Payer: BLUE CROSS/BLUE SHIELD | Source: Ambulatory Visit | Attending: Family Medicine | Admitting: Family Medicine

## 2017-09-06 DIAGNOSIS — M25512 Pain in left shoulder: Secondary | ICD-10-CM

## 2017-09-08 ENCOUNTER — Telehealth (INDEPENDENT_AMBULATORY_CARE_PROVIDER_SITE_OTHER): Payer: Self-pay | Admitting: Family Medicine

## 2017-09-08 DIAGNOSIS — M25512 Pain in left shoulder: Secondary | ICD-10-CM

## 2017-09-08 NOTE — Telephone Encounter (Signed)
Rotator cuff shows inflammation, but no tears.  No surgery indicated at this point.  Will request physical therapy.  [facility]

## 2017-09-09 ENCOUNTER — Encounter (INDEPENDENT_AMBULATORY_CARE_PROVIDER_SITE_OTHER): Payer: Self-pay | Admitting: *Deleted

## 2017-09-22 ENCOUNTER — Telehealth: Payer: Self-pay | Admitting: Neurology

## 2017-09-22 DIAGNOSIS — G35 Multiple sclerosis: Secondary | ICD-10-CM

## 2017-09-22 MED ORDER — ZOLPIDEM TARTRATE 10 MG PO TABS: 10.0000 mg | ORAL_TABLET | Freq: Every evening | ORAL | 0 refills | Status: DC | PRN

## 2017-09-22 MED ORDER — PREDNISONE 10 MG PO TABS: ORAL_TABLET | ORAL | 0 refills | Status: DC

## 2017-09-22 NOTE — Telephone Encounter (Signed)
Called patient.  The patient over the last 2 weeks has had onset of left arm numbness, and now left leg, balance changes, increased fatigue, some change in vision in the left eye.  The patient is on Gilenya.  He has not wanted to perform MRI evaluation of the brain in the recent past.  I will call in a prednisone Dosepak, we will get MRI of the brain, I will get a revisit, we need to decide whether or not to change off of Gilenya.  I would recommend that he not fly to [place] until he is feeling better.  He has requested a sleeping pill to take when he takes the prednisone.  I will call in Ambien.

## 2017-09-22 NOTE — Telephone Encounter (Signed)
Called pt back. For the last week, he is having "tingling" on left side of body. He has fallen a few times, he loses his balance. Most recent fall today at 2pm. Also fell this past Sunday. Last week he fell when he was trying to get out of bed. Legs gave out under him. Today he woke up and has no energy.  Denies chest pain. BP 120/79. HR 80. Verified he is taking Gilenya 0.5mg  daily but he has missed a few days last week when he was in a lot of pain. He could not get out of bed. He also has not taken his dose today.   He is supposed to fly to [place] tomorrow morning at 700am. He is worried about leaving since he is having these sx.   He is not sleeping well. Had shoulder injury and had MRI a couple weeks ago. No tear found.   Verified pharmacy on file. Advised I will send message to Dr. Anne Hahn and we will call back to advise.

## 2017-09-22 NOTE — Telephone Encounter (Signed)
Pt called requesting to speak with RN, Stating he feels he is having an MS exacerbation. Pt states his whole left side of his body is "tingling" feels he is in a fog and off balance. Symptoms have been worsening over the past week. Please call to advise

## 2017-09-23 ENCOUNTER — Telehealth: Payer: Self-pay | Admitting: Neurology

## 2017-09-23 DIAGNOSIS — G35 Multiple sclerosis: Secondary | ICD-10-CM

## 2017-09-23 NOTE — Telephone Encounter (Signed)
unable to leave vmail the mailbox is full  [employer] Auth: [id number] (exp. 09/23/17 to 10/22/17).

## 2017-09-23 NOTE — Telephone Encounter (Addendum)
Called pt, mailbox full and unable to leave message.  If patient calls please schedule appt in a couple weeks. Can offer 730am appt.

## 2017-09-24 NOTE — Telephone Encounter (Signed)
Pt returning RNs call advised the appt for 9/24 at 7:30 has been scheduled

## 2017-09-24 NOTE — Telephone Encounter (Signed)
Pt had scheduled appt already yesterday for 10/07/17 at 730am.

## 2017-09-24 NOTE — Telephone Encounter (Signed)
Tried calling to. Went to VM again and mailbox full

## 2017-10-02 ENCOUNTER — Other Ambulatory Visit (INDEPENDENT_AMBULATORY_CARE_PROVIDER_SITE_OTHER): Payer: Self-pay

## 2017-10-02 DIAGNOSIS — Z0289 Encounter for other administrative examinations: Secondary | ICD-10-CM

## 2017-10-02 DIAGNOSIS — G35 Multiple sclerosis: Secondary | ICD-10-CM

## 2017-10-02 NOTE — Telephone Encounter (Signed)
Patient returned my call he is scheduled for [facility] for Saturday 10/04/17 to arrive at 12:30 pm.  Patient also needs a recent BUN & Creatine labs before the MRI. He is going to come to our office some time today to get those drawn. When you get a chance can you put the order in.

## 2017-10-02 NOTE — Telephone Encounter (Signed)
Left a voicemail with Brandy at [facility] Pre [facility] informing her that the serving location has now been switched to [facility] and everything should be good.

## 2017-10-02 NOTE — Telephone Encounter (Signed)
Irving Burton called to verify about labs. They need BUN/creatinine within the last 6 weeks. Pt will need repeat labs.

## 2017-10-02 NOTE — Telephone Encounter (Signed)
Pt called back and spoke with phone staff. He will come today to have labs.

## 2017-10-02 NOTE — Telephone Encounter (Signed)
The patient has had BUN/creatinine documented that is normal within the last 90 days, this should suffice.

## 2017-10-02 NOTE — Telephone Encounter (Signed)
Tried calling pt, went to VM and VM fall, could not leave message. If he calls, please let him know he WILL need to come for labs today. Orders placed.

## 2017-10-02 NOTE — Telephone Encounter (Signed)
Joshua Boyer Pre Service xt [phone]  has called asking Joshua Boyer gives her a call re: the authorization for the MRI she states the wrong servicing provider is listed.  Pt is to have the MRI at [facility].  Please call

## 2017-10-02 NOTE — Telephone Encounter (Signed)
I called pt to let him know he had labs back in June that included BUN/creat. We will double check with [facility]. These should be sufficient. I will call him back if he still needs to come. He verbalized understanding.

## 2017-10-02 NOTE — Addendum Note (Signed)
Addended by: Hillis Range on: 10/02/2017 01:49 PM   Modules accepted: Orders

## 2017-10-03 LAB — CREATININE, SERUM
Creatinine, Ser: 1.05 mg/dL (ref 0.76–1.27)
GFR calc non Af Amer: 82 mL/min/{1.73_m2} (ref >59)
GFR, EST AFRICAN AMERICAN: 95 mL/min/{1.73_m2} (ref >59)

## 2017-10-03 LAB — BUN: BUN: 18 mg/dL (ref 6–24)

## 2017-10-04 ENCOUNTER — Ambulatory Visit (HOSPITAL_COMMUNITY)
Admission: RE | Admit: 2017-10-04 | Discharge: 2017-10-04 | Disposition: A | Discharge status: Home / Self Care | Payer: BLUE CROSS/BLUE SHIELD | Source: Ambulatory Visit | Attending: Neurology | Admitting: Neurology

## 2017-10-04 DIAGNOSIS — G35 Multiple sclerosis: Secondary | ICD-10-CM | POA: Diagnosis not present

## 2017-10-04 MED ORDER — GADOBUTROL 1 MMOL/ML IV SOLN
7.0000 mL | Freq: Once | INTRAVENOUS | Status: AC | PRN
  Administered 2017-10-04: 7 mL via INTRAVENOUS

## 2017-10-05 ENCOUNTER — Telehealth: Payer: Self-pay | Admitting: Neurology

## 2017-10-05 NOTE — Telephone Encounter (Signed)
  I called the patient.  MRI of the brain shows no definite new enhancing lesions, the patient has been placed on prednisone, his vision, and left-sided numbness have essentially normalized.  The patient otherwise has done well on Gilenya, we will be seeing him in the next 2 days in office, we may decide to keep him on the medication.  MRI Brain 10/05/17:  IMPRESSION: Findings consistent with chronic multiple sclerosis.  No lesions display diffusion-weighted abnormality or postcontrast enhancement.

## 2017-10-07 ENCOUNTER — Ambulatory Visit (INDEPENDENT_AMBULATORY_CARE_PROVIDER_SITE_OTHER): Payer: BLUE CROSS/BLUE SHIELD | Admitting: Neurology

## 2017-10-07 ENCOUNTER — Encounter: Payer: Self-pay | Admitting: Neurology

## 2017-10-07 VITALS — BP 106/71 | HR 79 | Ht 76.0 in | Wt 172.0 lb

## 2017-10-07 DIAGNOSIS — F5104 Psychophysiologic insomnia: Secondary | ICD-10-CM

## 2017-10-07 DIAGNOSIS — G35 Multiple sclerosis: Secondary | ICD-10-CM | POA: Diagnosis not present

## 2017-10-07 HISTORY — DX: Psychophysiologic insomnia: F51.04

## 2017-10-07 NOTE — Progress Notes (Signed)
Reason for visit: Multiple sclerosis  Joshua Boyer is an 52 y.o. male  History of present illness:  Joshua Boyer is a 52 year old right-handed black male with a history of migraine headaches and multiple sclerosis.  The patient has chronic insomnia, he has had issues with getting to sleep since he was in college.  The patient remains on Gilenya, he is doing well with this medication.  He claims that he began to have new symptoms of left arm numbness and left leg numbness, balance changes, increased fatigue, and some change in the left eye vision.  The patient was placed on a prednisone Dosepak, MRI of the brain was done but no enhancing lesions were noted.  The patient claims that he was under a lot of stress around the time of onset of symptoms, his symptoms have essentially disappeared.  He called our office on 22 September 2017 with onset of symptoms.  The patient returns for an evaluation.  Past Medical History:  Diagnosis Date  . Chronic insomnia 10/07/2017  . Migraine headache   . MS (multiple sclerosis) (HCC)   . Multiple sclerosis (HCC) 11/27/2012  . Optic neuritis, left     Past Surgical History:  Procedure Laterality Date  . HAND TENDON SURGERY    . HERNIA REPAIR    . KNEE SURGERY    . ROTATOR CUFF REPAIR      Family History  Problem Relation Age of Onset  . Glaucoma Mother   . Diabetes Mother   . Stroke Father   . Heart attack Father   . Cancer - Prostate Father     Social history:  reports that he has been smoking. He has been smoking about 0.75 packs per day. He has never used smokeless tobacco. He reports that he drinks alcohol. He reports that he does not use drugs.   No Known Allergies  Medications:  Prior to Admission medications   Medication Sig Start Date End Date Taking? Authorizing Provider  Cholecalciferol (VITAMIN D3) 1000 UNITS CAPS Take 1 capsule (1,000 Units total) by mouth daily. 11/27/12   York Spaniel, MD  GILENYA 0.5 MG CAPS TAKE ONE  CAPSULE (0.5 MG) BY MOUTH ONCE DAILY. MAY TAKE WITH OR WITHOUT FOOD. STORE AT ROOM TEMPERATURE. 12/19/16   Butch Penny, NP  predniSONE (DELTASONE) 10 MG tablet Begin taking 6 tablets daily, taper by one tablet every other day until off the medication. 09/22/17   York Spaniel, MD  traMADol (ULTRAM) 50 MG tablet Take 1 tablet (50 mg total) by mouth every 6 (six) hours as needed. 08/29/17   Hilts, Casimiro Needle, MD  zolpidem (AMBIEN) 10 MG tablet Take 1 tablet (10 mg total) by mouth at bedtime as needed for up to 15 days for sleep. 09/22/17 10/07/17  York Spaniel, MD    ROS:  Out of a complete 14 system review of symptoms, the patient complains only of the following symptoms, and all other reviewed systems are negative.  Chronic insomnia  Blood pressure 106/71, pulse 79, height 6\' 4"  (1.93 m), weight 172 lb (78 kg).  Physical Exam  General: The patient is alert and cooperative at the time of the examination.  Skin: No significant peripheral edema is noted.   Neurologic Exam  Mental status: The patient is alert and oriented x 3 at the time of the examination. The patient has apparent normal recent and remote memory, with an apparently normal attention span and concentration ability.   Cranial nerves: Facial symmetry  is present. Speech is normal, no aphasia or dysarthria is noted. Extraocular movements are full. Visual fields are full.  Pupils are equal, round, and reactive to light.  Discs are flat bilaterally.  Motor: The patient has good strength in all 4 extremities.  Sensory examination: Soft touch sensation is symmetric on the face, arms, and legs.  Coordination: The patient has good finger-nose-finger and heel-to-shin bilaterally.  Gait and station: The patient has a normal gait. Tandem gait is normal. Romberg is negative. No drift is seen.  Reflexes: Deep tendon reflexes are symmetric.   MRI Brain 10/05/17:  IMPRESSION: Findings consistent with chronic multiple  sclerosis.  No lesions display diffusion-weighted abnormality or postcontrast enhancement.   Assessment/Plan:  1.  Multiple sclerosis, recent exacerbation  2.  Chronic insomnia  The patient will remain on Gilenya for now, the MRI of the brain looks stable.  I will try to get the disc from the scan done in 2014 to compare.  The patient will follow-up through this office for his next scheduled visit in January.  He will try Benadryl for sleep, he will call me if he cannot improve his chronic insomnia.  Marlan Palau MD 10/07/2017 6:05 PM  [facility] [address] [place], [place] [address]  Phone [phone] Fax [phone]

## 2017-10-08 ENCOUNTER — Telehealth: Payer: Self-pay | Admitting: Neurology

## 2017-10-08 NOTE — Telephone Encounter (Signed)
I have reviewed MRI results from the study done on 25 October 2011 with the most current study, there appear to be 2 new nonenhancing lesions, one in the anterior right frontal lobe, and 1 in the mid left frontal lobe.  No other new lesions are seen.

## 2017-10-16 ENCOUNTER — Telehealth: Payer: Self-pay | Admitting: Neurology

## 2017-10-16 NOTE — Telephone Encounter (Signed)
Pt said Rn,[facility] advised he get a shingles and pneumonia vaccination. She said some neurologist want pts to stop taking gilenya to get these vaccinations. Please call to advise if he should get these. He is aware Dr Anne Hahn is out of the office until Tuesday but said it is ok for another provider to respond to his question.

## 2017-10-16 NOTE — Telephone Encounter (Signed)
I called patient back. Advised I spoke with Dr. Terrace Arabia since Dr. Anne Hahn out and advised he can receive shingrix vaccine and pneumonia vaccine as long as its the dead virus. He verbalized understanding.

## 2017-10-23 ENCOUNTER — Telehealth: Payer: Self-pay | Admitting: *Deleted

## 2017-10-23 NOTE — Telephone Encounter (Signed)
Noted  

## 2017-10-23 NOTE — Telephone Encounter (Signed)
Recommending pt to have repeat eye exam since starting gilenya, if he has not done so.  I called pt and he had eye exam 2 wks from Dr. Nile Riggs.  Stated everything was ok.  Note in care everywhere.

## 2017-11-10 ENCOUNTER — Other Ambulatory Visit: Payer: Self-pay | Admitting: Adult Health

## 2017-11-12 ENCOUNTER — Telehealth: Payer: Self-pay | Admitting: *Deleted

## 2017-11-12 NOTE — Telephone Encounter (Signed)
Received enrollment for pt for gilenya therapy.  Will let us know status in 48 hours benefit verification.

## 2018-02-04 ENCOUNTER — Ambulatory Visit: Payer: BLUE CROSS/BLUE SHIELD | Admitting: Adult Health

## 2018-02-04 ENCOUNTER — Telehealth: Payer: Self-pay

## 2018-02-04 NOTE — Telephone Encounter (Signed)
Patient was a no call/no show for their appointment today.   

## 2018-02-05 ENCOUNTER — Encounter: Payer: Self-pay | Admitting: Adult Health

## 2018-02-06 ENCOUNTER — Ambulatory Visit: Payer: BLUE CROSS/BLUE SHIELD | Admitting: Adult Health

## 2018-02-27 ENCOUNTER — Other Ambulatory Visit: Payer: Self-pay

## 2018-02-27 ENCOUNTER — Ambulatory Visit (INDEPENDENT_AMBULATORY_CARE_PROVIDER_SITE_OTHER): Payer: 59 | Admitting: Neurology

## 2018-02-27 ENCOUNTER — Encounter: Payer: Self-pay | Admitting: Neurology

## 2018-02-27 VITALS — BP 153/99 | HR 103 | Resp 16 | Ht 76.0 in | Wt 193.5 lb

## 2018-02-27 DIAGNOSIS — G35 Multiple sclerosis: Secondary | ICD-10-CM | POA: Diagnosis not present

## 2018-02-27 NOTE — Progress Notes (Signed)
PATIENT: Joshua Boyer DOB: 07-Nov-1965  REASON FOR VISIT: follow up HISTORY FROM: patient  HISTORY OF PRESENT ILLNESS: Today 02/27/18  Joshua Boyer is a 53 year old African-American male with history of MS who presents for follow-up today.  The patient remains on Gilenya and is doing well with this medication.  He reports he had an exacerbation back in September 2019 with left arm and leg numbness, balance change, fatigue, and change in the vision in his left eye.  He was treated with a prednisone Dosepak and his symptoms resolved shortly thereafter.  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  He denies any problems or concerns today.  He denies any new symptoms of numbness or weakness.  He denies any problems with his bowels or bladder.  He denies any falls.  He denies any changes in his vision.  He reports that he is currently in between jobs and looking for a new job.  He had a normal eye exam in September 2019 with Dr. Nile RiggsShapiro.  He reports that he is sleeping better now that he is taking CBD with melatonin.  He presents today for follow-up unaccompanied.  HISTORY Joshua Boyer is a 53 year old right-handed black male with a history of migraine headaches and multiple sclerosis.  The patient has chronic insomnia, he has had issues with getting to sleep since he was in college.  The patient remains on Gilenya, he is doing well with this medication.  He claims that he began to have new symptoms of left arm numbness and left leg numbness, balance changes, increased fatigue, and some change in the left eye vision.  The patient was placed on a prednisone Dosepak, MRI of the brain was done but no enhancing lesions were noted.  The patient claims that he was under a lot of stress around the time of onset of symptoms, his symptoms have essentially disappeared.  He called our office on 22 September 2017 with onset of symptoms.  The patient returns for  an evaluation.  REVIEW OF SYSTEMS: Out of a complete 14 system review of symptoms, the patient complains only of the following symptoms, and all other reviewed systems are negative.   ALLERGIES: No Known Allergies  HOME MEDICATIONS: Outpatient Medications Prior to Visit  Medication Sig Dispense Refill  . Cholecalciferol (VITAMIN D3) 1000 UNITS CAPS Take 1 capsule (1,000 Units total) by mouth daily.    Marland Kitchen. GILENYA 0.5 MG CAPS TAKE ONE CAPSULE BY MOUTH ONCE DAILY. MAY TAKE WITH OR WITHOUT FOOD. STORE AT ROOM TEMPERATURE. 90 capsule 1  . GILENYA 0.5 MG CAPS TAKE ONE CAPSULE BY MOUTH ONCE DAILY. MAY TAKE WITH OR WITHOUT FOOD. STORE AT ROOM TEMPERATURE. 90 capsule 1   No facility-administered medications prior to visit.     PAST MEDICAL HISTORY: Past Medical History:  Diagnosis Date  . Chronic insomnia 10/07/2017  . Migraine headache   . MS (multiple sclerosis) (HCC)   . Multiple sclerosis (HCC) 11/27/2012  . Optic neuritis, left     PAST SURGICAL HISTORY: Past Surgical History:  Procedure Laterality Date  . HAND TENDON SURGERY    . HERNIA REPAIR    . KNEE SURGERY    . ROTATOR CUFF REPAIR      FAMILY HISTORY: Family History  Problem Relation Age of Onset  . Glaucoma Mother   . Diabetes Mother   . Stroke Father   . Heart attack Father   .  Cancer - Prostate Father     SOCIAL HISTORY: Social History   Socioeconomic History  . Marital status: Divorced    Spouse name: Not on file  . Number of children: 1  . Years of education: BS   . Highest education level: Not on file  Occupational History    Employer: [employer]  Social Needs  . Financial resource strain: Not on file  . Food insecurity:    Worry: Not on file    Inability: Not on file  . Transportation needs:    Medical: Not on file    Non-medical: Not on file  Tobacco Use  . Smoking status: Current Every Day Smoker    Packs/day: 0.75  . Smokeless tobacco: Never Used  Substance and Sexual Activity  . Alcohol use:  Yes    Alcohol/week: 0.0 standard drinks    Comment: social  . Drug use: No  . Sexual activity: Never  Lifestyle  . Physical activity:    Days per week: Not on file    Minutes per session: Not on file  . Stress: Not on file  Relationships  . Social connections:    Talks on phone: Not on file    Gets together: Not on file    Attends religious service: Not on file    Active member of club or organization: Not on file    Attends meetings of clubs or organizations: Not on file    Relationship status: Not on file  . Intimate partner violence:    Fear of current or ex partner: Not on file    Emotionally abused: Not on file    Physically abused: Not on file    Forced sexual activity: Not on file  Other Topics Concern  . Not on file  Social History Narrative   Patient lives at home alone.    Patient has 1 child.    Patient is currently working.    Patient is right handed.    Patient has his BS degree.   Patient drinks 1-2 cups of caffeine daily.         PHYSICAL EXAM  Vitals:   02/27/18 0944  BP: (!) 153/99  Pulse: (!) 103  Resp: 16  Weight: 193 lb 8 oz (87.8 kg)  Height: 6\' 4"  (1.93 m)   Body mass index is 23.55 kg/m.  Generalized: Well developed, in no acute distress   Neurological examination  Mentation: Alert oriented to time, place, history taking. Follows all commands speech and language fluent Cranial nerve II-XII: Pupils were equal round reactive to light. Extraocular movements were full, visual field were full on confrontational test. Facial sensation and strength were normal. Uvula tongue midline. Head turning and shoulder shrug  were normal and symmetric. Motor: The motor testing reveals 5 over 5 strength of all 4 extremities. Good symmetric motor tone is noted throughout.  Sensory: Sensory testing is intact to soft touch on all 4 extremities. No evidence of extinction is noted.  Coordination: Cerebellar testing reveals good finger-nose-finger and  heel-to-shin bilaterally.  Gait and station: Gait is normal. Tandem gait is normal. Romberg is negative. No drift is seen.  Reflexes: Deep tendon reflexes are symmetric and normal bilaterally.   DIAGNOSTIC DATA (LABS, IMAGING, TESTING) - I reviewed patient records, labs, notes, testing and imaging myself where available.  Lab Results  Component Value Date   WBC 9.3 07/10/2017   HGB 14.3 07/10/2017   HCT 43.4 07/10/2017   MCV 93 07/10/2017   PLT 304  07/10/2017      Component Value Date/Time   NA 142 07/10/2017 0939   K 4.7 07/10/2017 0939   CL 104 07/10/2017 0939   CO2 25 07/10/2017 0939   GLUCOSE 109 (H) 07/10/2017 0939   GLUCOSE 98 02/09/2007 1130   BUN 18 10/02/2017 1421   CREATININE 1.05 10/02/2017 1421   CALCIUM 9.8 07/10/2017 0939   PROT 7.0 07/10/2017 0939   ALBUMIN 4.6 07/10/2017 0939   AST 15 07/10/2017 0939   ALT 13 07/10/2017 0939   ALKPHOS 64 07/10/2017 0939   BILITOT 0.5 07/10/2017 0939   GFRNONAA 82 10/02/2017 1421   GFRAA 95 10/02/2017 1421   No results found for: CHOL, HDL, LDLCALC, LDLDIRECT, TRIG, CHOLHDL No results found for: HFSF4E No results found for: VITAMINB12 No results found for: TSH    ASSESSMENT AND PLAN 53 y.o. year old male  has a past medical history of Chronic insomnia (10/07/2017), Migraine headache, MS (multiple sclerosis) (HCC), Multiple sclerosis (HCC) (11/27/2012), and Optic neuritis, left. here with:  1.  Multiple sclerosis  Overall Joshua Boyer is doing quite well and is tolerating the Gilenya.  He denies any new symptoms today.  He will continue taking Gilenya 0.5 mg once daily.  I will check lab work today.  In January 2019 his absolute lymphocyte count was 1.0.  He had a normal eye exam in September 2019.  He had an MRI of the brain in September 2019 that showed no definite new enhancing lesions.  He will follow-up in 6 months in our office or sooner if needed.  I advised him that if his symptoms should worsen or if he develops any  new symptoms he should let us know.   I spent 15 minutes with the patient. 50% of this time was spent discussing his plan of care.   Margie Ege, AGNP-C, DNP 02/27/2018, 10:04 AM [facility] [address] [place], [place] [address] [phone]

## 2018-02-27 NOTE — Progress Notes (Signed)
I have read the note, and I agree with the clinical assessment and plan.  Charles K Willis   

## 2018-02-28 LAB — CBC WITH DIFFERENTIAL/PLATELET
BASOS: 0 %
Basophils Absolute: 0 10*3/uL (ref 0.0–0.2)
EOS (ABSOLUTE): 0.2 10*3/uL (ref 0.0–0.4)
EOS: 2 %
HEMATOCRIT: 41.8 % (ref 37.5–51.0)
Hemoglobin: 14.4 g/dL (ref 13.0–17.7)
Immature Grans (Abs): 0 10*3/uL (ref 0.0–0.1)
Immature Granulocytes: 0 %
Lymphocytes Absolute: 1.7 10*3/uL (ref 0.7–3.1)
Lymphs: 17 %
MCH: 30.6 pg (ref 26.6–33.0)
MCHC: 34.4 g/dL (ref 31.5–35.7)
MCV: 89 fL (ref 79–97)
MONOS ABS: 0.8 10*3/uL (ref 0.1–0.9)
Monocytes: 8 %
Neutrophils Absolute: 7.3 10*3/uL — ABNORMAL HIGH (ref 1.4–7.0)
Neutrophils: 73 %
Platelets: 288 10*3/uL (ref 150–450)
RBC: 4.71 x10E6/uL (ref 4.14–5.80)
RDW: 13.3 % (ref 11.6–15.4)
WBC: 9.9 10*3/uL (ref 3.4–10.8)

## 2018-02-28 LAB — COMPREHENSIVE METABOLIC PANEL
A/G RATIO: 2 (ref 1.2–2.2)
ALT: 19 IU/L (ref 0–44)
AST: 16 IU/L (ref 0–40)
Albumin: 4.7 g/dL (ref 3.8–4.9)
Alkaline Phosphatase: 61 IU/L (ref 39–117)
BUN/Creatinine Ratio: 17 (ref 9–20)
BUN: 21 mg/dL (ref 6–24)
Bilirubin Total: 0.2 mg/dL (ref 0.0–1.2)
CO2: 19 mmol/L — ABNORMAL LOW (ref 20–29)
Calcium: 9.9 mg/dL (ref 8.7–10.2)
Chloride: 104 mmol/L (ref 96–106)
Creatinine, Ser: 1.27 mg/dL (ref 0.76–1.27)
GFR calc Af Amer: 75 mL/min/{1.73_m2} (ref >59)
GFR calc non Af Amer: 65 mL/min/{1.73_m2} (ref >59)
Globulin, Total: 2.4 g/dL (ref 1.5–4.5)
Glucose: 136 mg/dL — ABNORMAL HIGH (ref 65–99)
Potassium: 4.5 mmol/L (ref 3.5–5.2)
Sodium: 145 mmol/L — ABNORMAL HIGH (ref 134–144)
Total Protein: 7.1 g/dL (ref 6.0–8.5)

## 2018-03-03 ENCOUNTER — Telehealth: Payer: Self-pay

## 2018-03-03 NOTE — Telephone Encounter (Signed)
Spoke with the patient and he verbalized understanding his results. No questions or concerns at this time.   

## 2018-03-03 NOTE — Telephone Encounter (Signed)
-----   Message from Glean Salvo, NP sent at 03/02/2018  8:15 AM EST ----- Please call the patient and let him know that he lab work was okay. His absolute lymphocyte count was 1.7. His glucose was elevated at 136. His sodium was mildly high at 145. Thank you.

## 2018-03-28 ENCOUNTER — Other Ambulatory Visit: Payer: Self-pay | Admitting: Adult Health

## 2018-04-07 ENCOUNTER — Other Ambulatory Visit: Payer: Self-pay | Admitting: Adult Health

## 2018-08-24 ENCOUNTER — Encounter: Payer: Self-pay | Admitting: Neurology

## 2018-08-24 ENCOUNTER — Telehealth: Payer: Self-pay | Admitting: Neurology

## 2018-08-24 NOTE — Telephone Encounter (Signed)
I called patient to attempt to reschedule 8/14 appt, due to office being closed. Patient's mailbox is full. Unable to LVM. I have sent patient a cancellation letter to advise.

## 2018-08-28 ENCOUNTER — Ambulatory Visit: Payer: 59 | Admitting: Neurology

## 2019-01-04 IMAGING — MR MR SHOULDER*L* W/O CM
4 of 5 series · 29 of 40 positions shown · non-contrast
Comparison: Radiographs from 08/12/2017

CLINICAL DATA: Left shoulder pain continues 4 weeks after injury.
Limited range of motion.

EXAM:
MRI OF THE LEFT SHOULDER WITHOUT CONTRAST
TECHNIQUE: Multiplanar, multisequence MR imaging of the shoulder was performed.
No intravenous contrast was administered.

[Series 4: T2 fat-sat · oblique · 4.0mm · 0.27mm/px · 8 of 17 slices shown (1 of 2)]
[im 1/17]
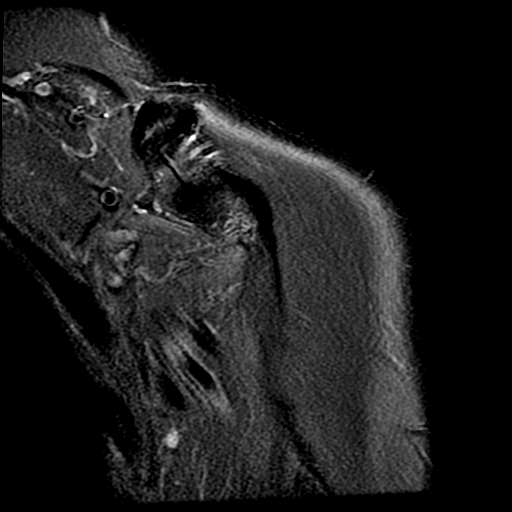
[im 3/17]
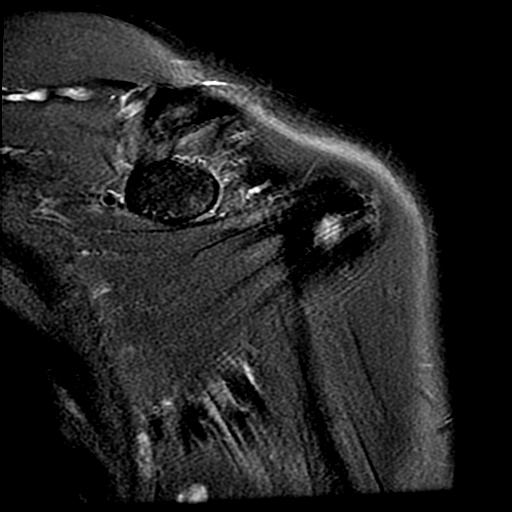
[im 5/17]
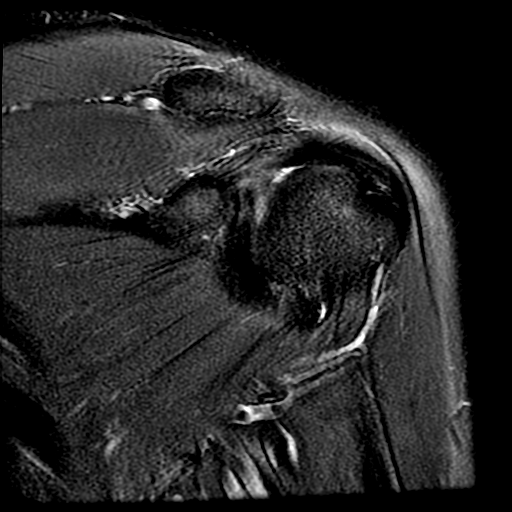
[im 7/17]
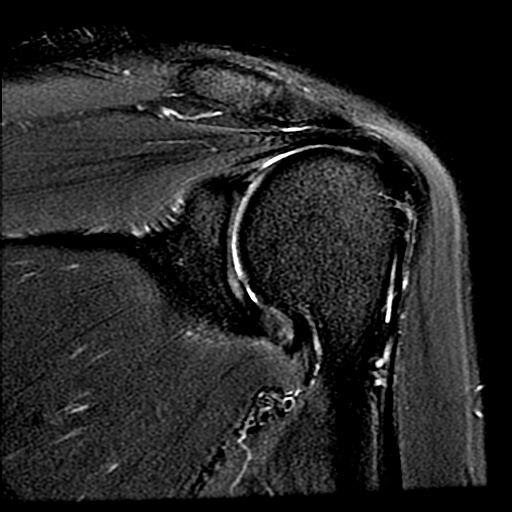
[im 10/17]
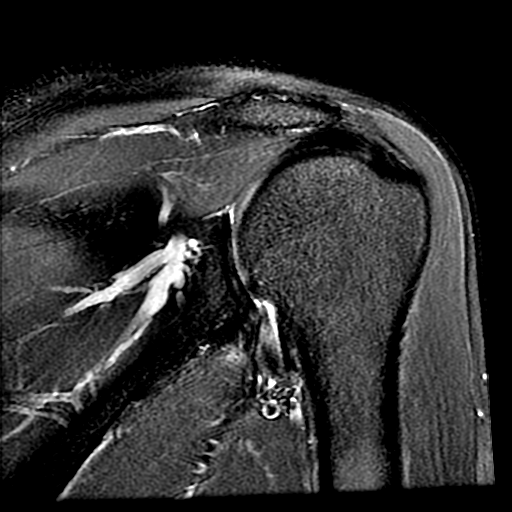
[im 12/17]
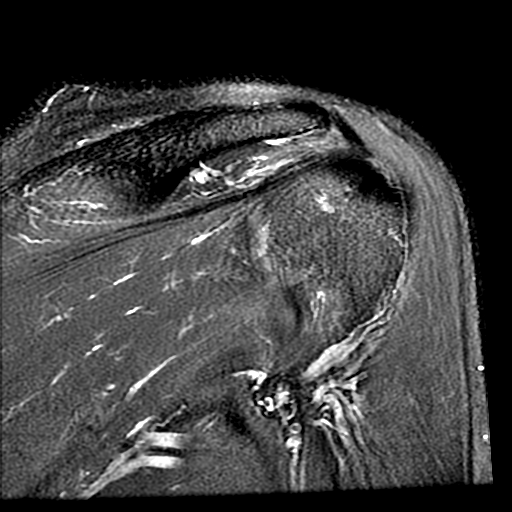
[im 14/17]
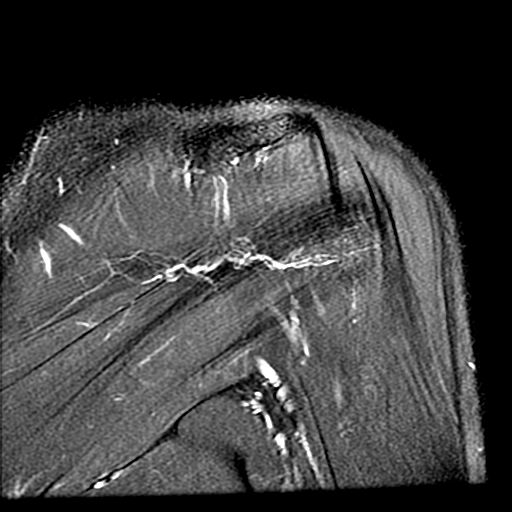
[im 17/17]
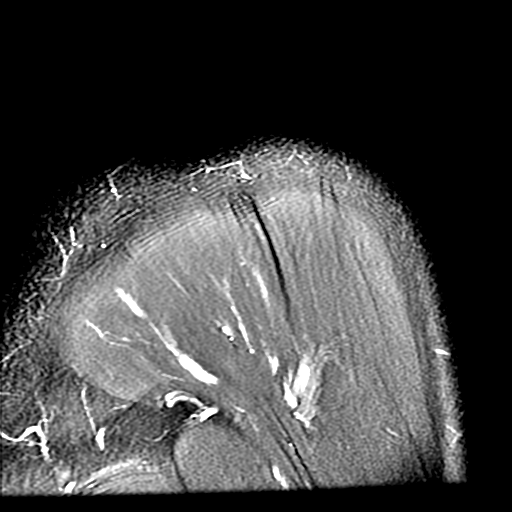

[Series 6: T2 fat-sat · oblique · 4.0mm · 0.55mm/px · 7 of 17 slices shown (2 of 2)]
[im 1/17]
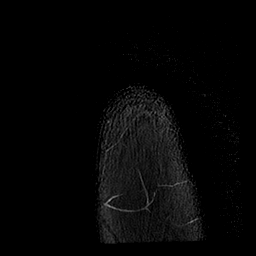
[im 3/17]
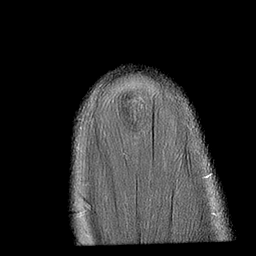
[im 6/17]
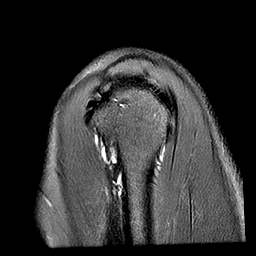
[im 9/17]
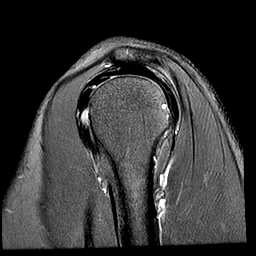
[im 11/17]
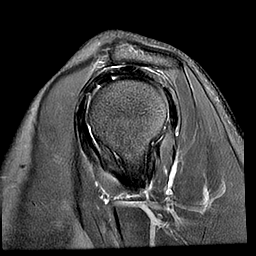
[im 14/17]
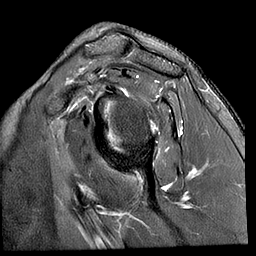
[im 17/17]
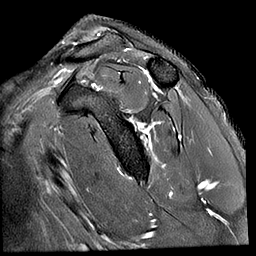

[Series 7: T1 · oblique · 4.0mm · 0.27mm/px · 7 of 17 slices shown]
[im 1/17]
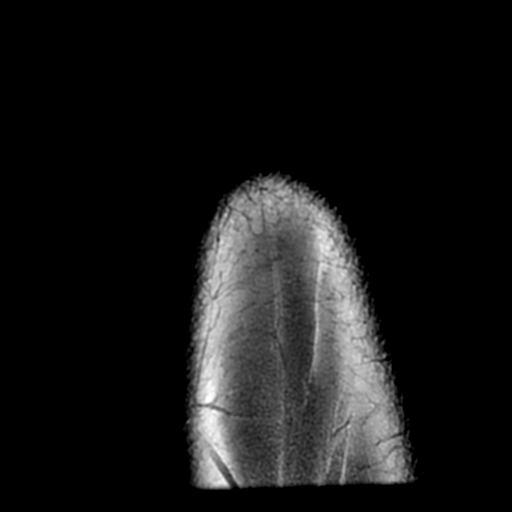
[im 3/17]
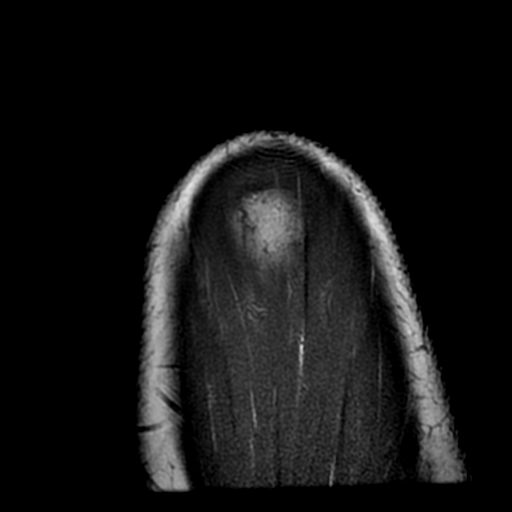
[im 6/17]
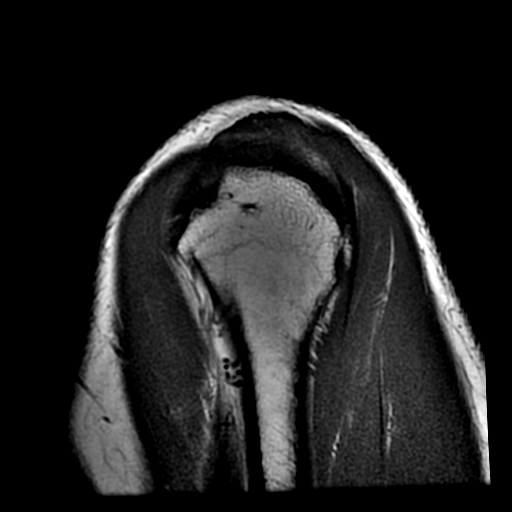
[im 9/17]
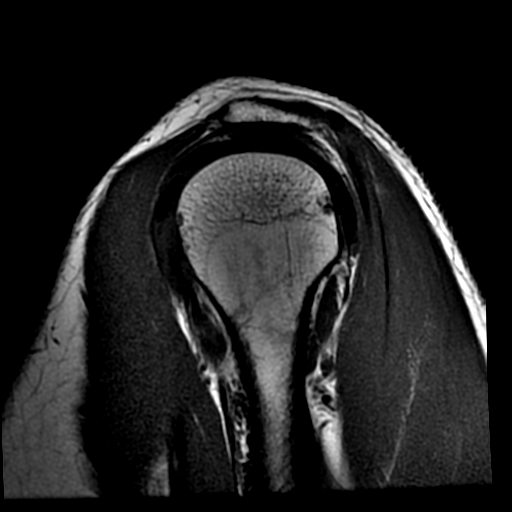
[im 11/17]
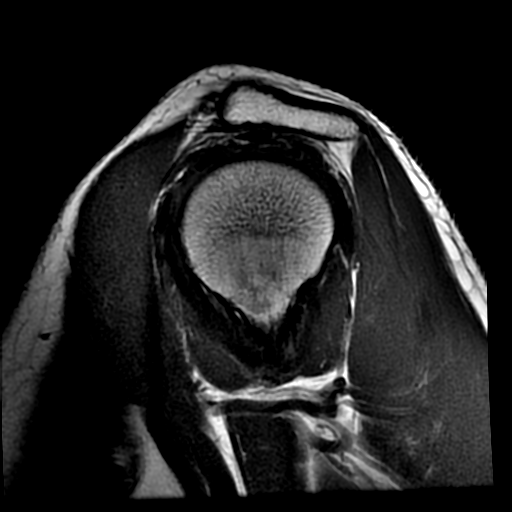
[im 14/17]
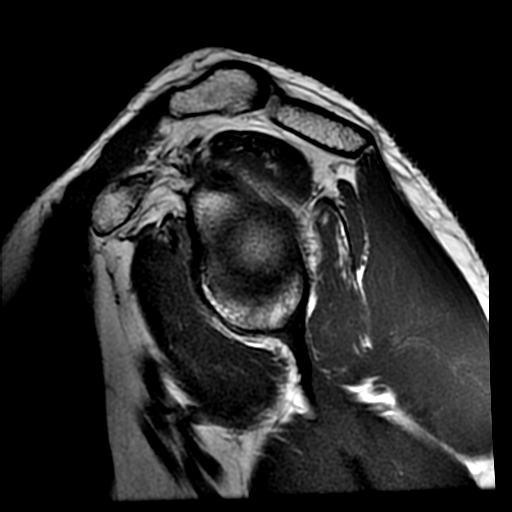
[im 17/17]
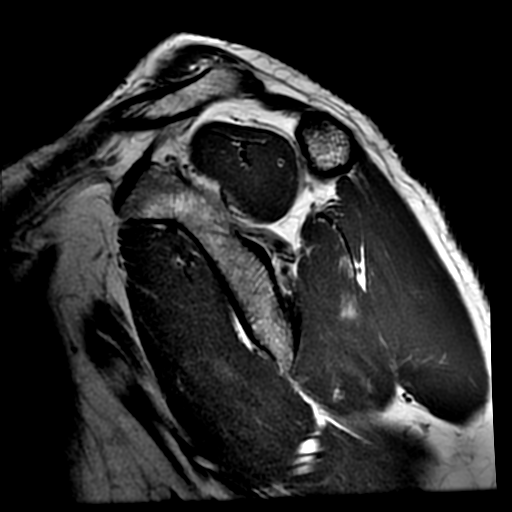

[Series 8: PD fat-sat · oblique · 4.0mm · 0.55mm/px · 7 of 17 slices shown]
[im 1/17]
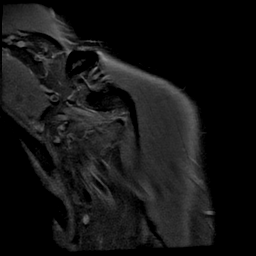
[im 3/17]
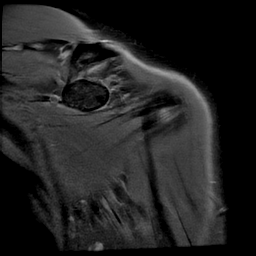
[im 6/17]
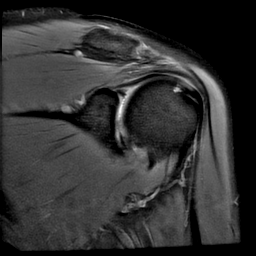
[im 9/17]
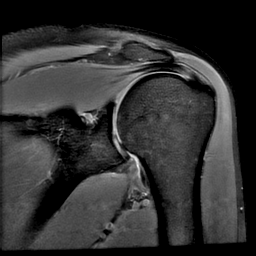
[im 11/17]
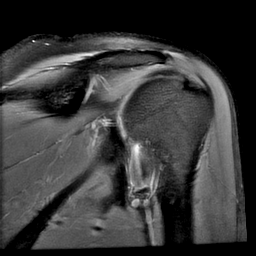
[im 14/17]
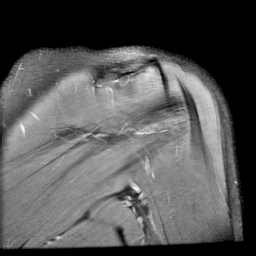
[im 17/17]
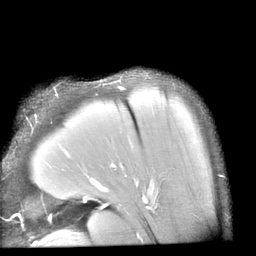

[29 of 40 positions shown; findings below may reference images not displayed]

FINDINGS: Rotator cuff:  Mild to moderate subscapularis tendinopathy distally.

Muscles:  Unremarkable

Biceps long head: Mild tendinopathy of the proximal intra-articular
segment of the biceps.

Acromioclavicular Joint: No arthropathy. Type I acromion. No fluid
in the subacromial subdeltoid bursa.

Glenohumeral Joint: Mild thickening and indistinctness of the
glenoid margin of the inferior glenohumeral ligament on images 8
through [DATE]. No obvious discontinuity or surrounding fluid. No
glenohumeral joint effusion. Mild degenerative chondral thinning
along the glenoid and humeral head.

Labrum:  Grossly unremarkable

Bones: No significant extra-articular osseous abnormalities
identified.

Other: No supplemental non-categorized findings.
IMPRESSION: 1. Mild to moderate subscapularis tendinopathy.
2. Mild tendinopathy of the proximal intra-articular segment of the
biceps.
3. Mildly thickened appearance of the glenoid attachment of the
inferior glenohumeral ligament, which may indicate a mild FLETES
injury. I do not see overt discontinuity.

## 2019-06-04 ENCOUNTER — Other Ambulatory Visit: Payer: Self-pay

## 2019-06-04 ENCOUNTER — Ambulatory Visit (INDEPENDENT_AMBULATORY_CARE_PROVIDER_SITE_OTHER): Payer: 59 | Admitting: Family Medicine

## 2019-06-04 ENCOUNTER — Encounter: Payer: Self-pay | Admitting: Family Medicine

## 2019-06-04 VITALS — BP 130/90 | HR 108 | Ht 73.5 in | Wt 197.6 lb

## 2019-06-04 DIAGNOSIS — G35 Multiple sclerosis: Secondary | ICD-10-CM

## 2019-06-04 DIAGNOSIS — Z0000 Encounter for general adult medical examination without abnormal findings: Secondary | ICD-10-CM

## 2019-06-04 DIAGNOSIS — Z720 Tobacco use: Secondary | ICD-10-CM

## 2019-06-04 DIAGNOSIS — R5383 Other fatigue: Secondary | ICD-10-CM | POA: Diagnosis not present

## 2019-06-04 NOTE — Progress Notes (Signed)
Office Visit Note   Patient: Joshua Boyer           Date of Birth: 06-Mar-1965           MRN: [id number] Visit Date: 06/04/2019 Requested by: Lavada Mesi, MD [address] [place],  [place] [address] PCP: Lavada Mesi, MD  Subjective: Chief Complaint  Patient presents with  . PCP    HPI: He is here for a checkup.  He has had issues with fatigue for the past year. He does not sleep well. He is also been under some stress since losing his job. He was without insurance for a while and had to stop taking his MS medication. Fortunately, he has not had any flareups.  He has a sensation of fluid in his right ear intermittently.  He is up-to-date on immunizations.               ROS: No bowel or bladder dysfunction. All other systems were reviewed and are negative.  Objective: Vital Signs: BP 130/90   Pulse (!) 108   Ht 6' 1.5" (1.867 m)   Wt 197 lb 9.6 oz (89.6 kg)   BMI 25.72 kg/m   Physical Exam:  General:  Alert and oriented, in no acute distress. Pulm:  Breathing unlabored. Psy:  Normal mood, congruent affect. Skin: No suspicious lesions seen. HEENT: TMs are pearly gray with normal landmarks. Neck has no thyromegaly, no thyroid nodules and no carotid bruits. No lymphadenopathy. CV: Regular rate and rhythm without murmurs, rubs, or gallops.  No peripheral edema.  2+ radial and posterior tibial pulses. Lungs: Clear to auscultation throughout with no wheezing or areas of consolidation. Extremities: No nail deformities.    Imaging: No results found.  Assessment & Plan: 1. Wellness examination -Labs to evaluate.  2. Chronic fatigue -If labs are normal, he plans to start working on his diet. He admits to not eating healthfully and this certainly could be contributing.  3. Tobacco abuse -He has started smoking again but is trying to quit.  4. MS -We will check his baseline liver function. He plans to schedule appointment with his neurologist  soon.     Procedures: No procedures performed  No notes on file     PMFS History: Patient Active Problem List   Diagnosis Date Noted  . Tobacco abuse 06/04/2019  . Chronic insomnia 10/07/2017  . Multiple sclerosis (HCC) 11/27/2012  . Optic neuritis 11/27/2012   Past Medical History:  Diagnosis Date  . Chronic insomnia 10/07/2017  . Migraine headache   . MS (multiple sclerosis) (HCC)   . Multiple sclerosis (HCC) 11/27/2012  . Optic neuritis, left     Family History  Problem Relation Age of Onset  . Glaucoma Mother   . Diabetes Mother   . Stroke Father   . Heart attack Father   . Cancer - Prostate Father     Past Surgical History:  Procedure Laterality Date  . HAND TENDON SURGERY    . HERNIA REPAIR    . KNEE SURGERY    . ROTATOR CUFF REPAIR     Social History   Occupational History    Employer: [employer]  Tobacco Use  . Smoking status: Current Every Day Smoker    Packs/day: 0.75  . Smokeless tobacco: Never Used  Substance and Sexual Activity  . Alcohol use: Yes    Alcohol/week: 0.0 standard drinks    Comment: social  . Drug use: No  . Sexual activity: Never

## 2019-06-04 NOTE — Progress Notes (Signed)
Concern of lack of energy A few Headaches possibly stress related Insomnia

## 2019-06-05 LAB — COMPREHENSIVE METABOLIC PANEL
AG Ratio: 1.7 (calc) (ref 1.0–2.5)
ALT: 10 U/L (ref 9–46)
AST: 11 U/L (ref 10–35)
Albumin: 4.7 g/dL (ref 3.6–5.1)
Alkaline phosphatase (APISO): 71 U/L (ref 35–144)
BUN: 18 mg/dL (ref 7–25)
CO2: 27 mmol/L (ref 20–32)
Calcium: 10 mg/dL (ref 8.6–10.3)
Chloride: 106 mmol/L (ref 98–110)
Creat: 1.15 mg/dL (ref 0.70–1.33)
Globulin: 2.8 g/dL (calc) (ref 1.9–3.7)
Glucose, Bld: 99 mg/dL (ref 65–99)
Potassium: 4.6 mmol/L (ref 3.5–5.3)
Sodium: 142 mmol/L (ref 135–146)
Total Bilirubin: 0.4 mg/dL (ref 0.2–1.2)
Total Protein: 7.5 g/dL (ref 6.1–8.1)

## 2019-06-05 LAB — CBC WITH DIFFERENTIAL/PLATELET
Absolute Monocytes: 882 cells/uL (ref 200–950)
Basophils Absolute: 76 cells/uL (ref 0–200)
Basophils Relative: 0.6 %
Eosinophils Absolute: 214 cells/uL (ref 15–500)
Eosinophils Relative: 1.7 %
HCT: 44 % (ref 38.5–50.0)
Hemoglobin: 14.9 g/dL (ref 13.2–17.1)
Lymphs Abs: 2986 cells/uL (ref 850–3900)
MCH: 30.8 pg (ref 27.0–33.0)
MCHC: 33.9 g/dL (ref 32.0–36.0)
MCV: 91.1 fL (ref 80.0–100.0)
MPV: 10 fL (ref 7.5–12.5)
Monocytes Relative: 7 %
Neutro Abs: 8442 cells/uL — ABNORMAL HIGH (ref 1500–7800)
Neutrophils Relative %: 67 %
Platelets: 333 10*3/uL (ref 140–400)
RBC: 4.83 10*6/uL (ref 4.20–5.80)
RDW: 13.6 % (ref 11.0–15.0)
Total Lymphocyte: 23.7 %
WBC: 12.6 10*3/uL — ABNORMAL HIGH (ref 3.8–10.8)

## 2019-06-05 LAB — LIPID PANEL
Cholesterol: 219 mg/dL — ABNORMAL HIGH (ref <200)
HDL: 38 mg/dL — ABNORMAL LOW (ref >=40)
LDL Cholesterol (Calc): 158 mg/dL (calc) — ABNORMAL HIGH
Non-HDL Cholesterol (Calc): 181 mg/dL (calc) — ABNORMAL HIGH (ref <130)
Total CHOL/HDL Ratio: 5.8 (calc) — ABNORMAL HIGH (ref <5.0)
Triglycerides: 114 mg/dL (ref <150)

## 2019-06-05 LAB — PSA: PSA: 1.5 ng/mL (ref <=4.0)

## 2019-06-05 LAB — THYROID PANEL WITH TSH
Free Thyroxine Index: 2.9 (ref 1.4–3.8)
T3 Uptake: 29 % (ref 22–35)
T4, Total: 9.9 ug/dL (ref 4.9–10.5)
TSH: 0.97 mIU/L (ref 0.40–4.50)

## 2019-06-05 LAB — VITAMIN D 25 HYDROXY (VIT D DEFICIENCY, FRACTURES): Vit D, 25-Hydroxy: 52 ng/mL (ref 30–100)

## 2019-07-05 ENCOUNTER — Ambulatory Visit (INDEPENDENT_AMBULATORY_CARE_PROVIDER_SITE_OTHER): Payer: 59 | Admitting: Adult Health

## 2019-07-05 ENCOUNTER — Telehealth: Payer: Self-pay | Admitting: Adult Health

## 2019-07-05 ENCOUNTER — Encounter: Payer: Self-pay | Admitting: Adult Health

## 2019-07-05 VITALS — BP 139/86 | HR 103 | Ht 76.0 in | Wt 195.0 lb

## 2019-07-05 DIAGNOSIS — G35 Multiple sclerosis: Secondary | ICD-10-CM

## 2019-07-05 NOTE — Progress Notes (Signed)
I have read the note, and I agree with the clinical assessment and plan.  Lyzette Reinhardt K Camaya [employer]   

## 2019-07-05 NOTE — Telephone Encounter (Signed)
Please advise patient that after discussing with Dr. Epimenio Foot we do advise that he restart Gilenya.   Kristen: he has been off for >1 year. Will have to start the entire process

## 2019-07-05 NOTE — Patient Instructions (Signed)
Your Plan:  MRI brain ordered If your symptoms worsen or you develop new symptoms please let us know.   Thank you for coming to see us at [facility]. I hope we have been able to provide you high quality care today.  You may receive a patient satisfaction survey over the next few weeks. We would appreciate your feedback and comments so that we may continue to improve ourselves and the health of our patients.  

## 2019-07-05 NOTE — Progress Notes (Addendum)
PATIENT: JOURDYN FERRIN DOB: 05-26-1965  REASON FOR VISIT: follow up HISTORY FROM: patient  HISTORY OF PRESENT ILLNESS: Today 07/05/19:  Mr. Jake is a 54 year old male with a history of relapsed remitting multiple sclerosis.  He returns today for follow-up.  He states that he has been off Gilenya for approximately 1 year.  He states that he stopped the medication during the pandemic as he was laid off from his job.  Reports that he could not r afford out-of-pocket cost.  He denies any new symptoms.  No change with his gait or balance.  No new numbness or weakness.  No changes with his vision.  Overall he feels that he has been doing well.  He returns today for follow-up.  HISTORY 02/27/18  Mr. Vroom is a 55 year old African-American male with history of MS who presents for follow-up today.  The patient remains on Gilenya and is doing well with this medication.  He reports he had an exacerbation back in September 2019 with left arm and leg numbness, balance change, fatigue, and change in the vision in his left eye.  He was treated with a prednisone Dosepak and his symptoms resolved shortly thereafter.  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  He denies any problems or concerns today.  He denies any new symptoms of numbness or weakness.  He denies any problems with his bowels or bladder.  He denies any falls.  He denies any changes in his vision.  He reports that he is currently in between jobs and looking for a new job.  He had a normal eye exam in September 2019 with Dr. Nile Riggs.  He reports that he is sleeping better now that he is taking CBD with melatonin.  He presents today for follow-up unaccompanied.   REVIEW OF SYSTEMS: Out of a complete 14 system review of symptoms, the patient complains only of the following symptoms, and all other reviewed systems are negative.  See HPI  ALLERGIES: No Known Allergies  HOME  MEDICATIONS: Outpatient Medications Prior to Visit  Medication Sig Dispense Refill  . Cholecalciferol (VITAMIN D3) 1000 UNITS CAPS Take 1 capsule (1,000 Units total) by mouth daily.    Marland Kitchen GILENYA 0.5 MG CAPS TAKE ONE CAPSULE BY MOUTH ONCE DAILY. MAY TAKE WITH OR WITHOUT FOOD. STORE AT ROOM TEMPERATURE. (Patient not taking: Reported on 06/04/2019) 90 capsule 3   No facility-administered medications prior to visit.    PAST MEDICAL HISTORY: Past Medical History:  Diagnosis Date  . Chronic insomnia 10/07/2017  . Migraine headache   . MS (multiple sclerosis) (HCC)   . Multiple sclerosis (HCC) 11/27/2012  . Optic neuritis, left     PAST SURGICAL HISTORY: Past Surgical History:  Procedure Laterality Date  . HAND TENDON SURGERY    . HERNIA REPAIR    . KNEE SURGERY    . ROTATOR CUFF REPAIR      FAMILY HISTORY: Family History  Problem Relation Age of Onset  . Glaucoma Mother   . Diabetes Mother   . Stroke Father   . Heart attack Father   . Cancer - Prostate Father     SOCIAL HISTORY: Social History   Socioeconomic History  . Marital status: Divorced    Spouse name: Not on file  . Number of children: 1  . Years of education: BS   . Highest education level: Not on file  Occupational History  Employer: [employer]  Tobacco Use  . Smoking status: Current Every Day Smoker    Packs/day: 0.75  . Smokeless tobacco: Never Used  Substance and Sexual Activity  . Alcohol use: Yes    Alcohol/week: 0.0 standard drinks    Comment: social  . Drug use: No  . Sexual activity: Never  Other Topics Concern  . Not on file  Social History Narrative   Patient lives at home alone.    Patient has 1 child.    Patient is currently working.    Patient is right handed.    Patient has his BS degree.   Patient drinks 1-2 cups of caffeine daily.      Social Determinants of Health   Financial Resource Strain:   . Difficulty of Paying Living Expenses:   Food Insecurity:   . Worried About Community education officer in the Last Year:   . Barista in the Last Year:   Transportation Needs:   . Freight forwarder (Medical):   Marland Kitchen Lack of Transportation (Non-Medical):   Physical Activity:   . Days of Exercise per Week:   . Minutes of Exercise per Session:   Stress:   . Feeling of Stress :   Social Connections:   . Frequency of Communication with Friends and Family:   . Frequency of Social Gatherings with Friends and Family:   . Attends Religious Services:   . Active Member of Clubs or Organizations:   . Attends Banker Meetings:   Marland Kitchen Marital Status:   Intimate Partner Violence:   . Fear of Current or Ex-Partner:   . Emotionally Abused:   Marland Kitchen Physically Abused:   . Sexually Abused:       PHYSICAL EXAM  Vitals:   07/05/19 1110  BP: 139/86  Pulse: (!) 103  Weight: 195 lb (88.5 kg)  Height: 6\' 4"  (1.93 m)   Body mass index is 23.74 kg/m.  Generalized: Well developed, in no acute distress   Neurological examination  Mentation: Alert oriented to time, place, history taking. Follows all commands speech and language fluent Cranial nerve II-XII: Pupils were equal round reactive to light. Extraocular movements were full, visual field were full on confrontational test. . Head turning and shoulder shrug  were normal and symmetric. Motor: The motor testing reveals 5 over 5 strength of all 4 extremities. Good symmetric motor tone is noted throughout.  Sensory: Sensory testing is intact to soft touch on all 4 extremities. No evidence of extinction is noted.  Coordination: Cerebellar testing reveals good finger-nose-finger and heel-to-shin bilaterally.  Gait and station: Gait is normal. Tandem gait is normal. Romberg is negative. No drift is seen.  Reflexes: Deep tendon reflexes are symmetric and normal bilaterally.   DIAGNOSTIC DATA (LABS, IMAGING, TESTING) - I reviewed patient records, labs, notes, testing and imaging myself where available.  Lab Results   Component Value Date   WBC 12.6 (H) 06/04/2019   HGB 14.9 06/04/2019   HCT 44.0 06/04/2019   MCV 91.1 06/04/2019   PLT 333 06/04/2019      Component Value Date/Time   NA 142 06/04/2019 1451   NA 145 (H) 02/27/2018 1009   K 4.6 06/04/2019 1451   CL 106 06/04/2019 1451   CO2 27 06/04/2019 1451   GLUCOSE 99 06/04/2019 1451   BUN 18 06/04/2019 1451   BUN 21 02/27/2018 1009   CREATININE 1.15 06/04/2019 1451   CALCIUM 10.0 06/04/2019 1451   PROT 7.5 06/04/2019  1451   PROT 7.1 02/27/2018 1009   ALBUMIN 4.7 02/27/2018 1009   AST 11 06/04/2019 1451   ALT 10 06/04/2019 1451   ALKPHOS 61 02/27/2018 1009   BILITOT 0.4 06/04/2019 1451   BILITOT 0.2 02/27/2018 1009   GFRNONAA 65 02/27/2018 1009   GFRAA 75 02/27/2018 1009   Lab Results  Component Value Date   CHOL 219 (H) 06/04/2019   HDL 38 (L) 06/04/2019   LDLCALC 158 (H) 06/04/2019   TRIG 114 06/04/2019   CHOLHDL 5.8 (H) 06/04/2019   No results found for: HGBA1C No results found for: VITAMINB12 Lab Results  Component Value Date   TSH 0.97 06/04/2019      ASSESSMENT AND PLAN 54 y.o. year old male  has a past medical history of Chronic insomnia (10/07/2017), Migraine headache, MS (multiple sclerosis) ([place]), Multiple sclerosis ([place]) (11/27/2012), and Optic neuritis, left. here with:  Relapsed remitting multiple sclerosis   We will repeat MRI of the brain with and without contrast  Restart Gilenya  Advised if symptoms worsen or he develops new symptoms he should let us know  Follow-up in 6 months or sooner if needed  I spent 25 minutes of face-to-face and non-face-to-face time with patient.  This included previsit chart review, lab review, study review, order entry, electronic health record documentation, patient education.  Ward Givens, MSN, NP-C 07/05/2019, 11:19 AM [facility] [address] [place], [place] [address] [phone]

## 2019-07-06 NOTE — Telephone Encounter (Signed)
I called pt. I discussed the process for restarting Gilenya including signing the start form, lab work, cardiology referral, ophthalmology referral, FDO at cardiology office, etc. Pt would prefer to not start Gilenya at this time. He would prefer to wait until his MRI results are available and then discuss options. He reports that he is quite reluctant to take medications. I advised him that I will let Megan, NP know of this update and that if he changes his mind in the meantime to let us know. Pt verbalized understanding.

## 2019-07-07 NOTE — Telephone Encounter (Signed)
noted 

## 2019-07-08 ENCOUNTER — Telehealth: Payer: Self-pay | Admitting: Adult Health

## 2019-07-08 NOTE — Telephone Encounter (Signed)
[facility] pending  

## 2019-07-12 NOTE — Telephone Encounter (Signed)
[facility]: [id number] (exp. 07/08/19 to 10/06/19) order sent to GI. They will reach out to the patient to schedule.

## 2019-07-15 ENCOUNTER — Ambulatory Visit
Admission: RE | Admit: 2019-07-15 | Discharge: 2019-07-15 | Disposition: A | Discharge status: Home / Self Care | Payer: 59 | Source: Ambulatory Visit | Attending: Adult Health | Admitting: Adult Health

## 2019-07-15 ENCOUNTER — Other Ambulatory Visit: Payer: Self-pay

## 2019-07-15 DIAGNOSIS — G35 Multiple sclerosis: Secondary | ICD-10-CM | POA: Diagnosis not present

## 2019-07-15 MED ORDER — GADOBENATE DIMEGLUMINE 529 MG/ML IV SOLN
18.0000 mL | Freq: Once | INTRAVENOUS | Status: AC | PRN
  Administered 2019-07-15: 18 mL via INTRAVENOUS

## 2019-07-20 NOTE — Telephone Encounter (Signed)
"  Results did show progression. He sent a mychart message he is willing to start Gilenya. Please call to setup."   I called pt. I discussed his MRI results and recommendations. He is willing to proceed with Gilenya and understands that he will need to come by the office for a start form signature and labs and that he will need an ophthalmology referral and cardiology referral and the FDO will be done at the cardiology office. Pt reports that he sees Dr. Nile Riggs and would like to see him to r/o macular edema prior to starting Gilenya. Pt will come by tomorrow for blood work and start form signature. Pt verbalized understanding of results, recommendations, and plan. Pt had no questions at this time but was encouraged to call back if questions arise.  Orders for CBC, LFTs, VZV ab, cardiology referral, ophthalmology referral have been placed. Start form for Gilenya has been placed at the front desk for pt's review and signature.

## 2019-07-20 NOTE — Addendum Note (Signed)
Addended by: Geronimo Running A on: 07/20/2019 01:08 PM   Modules accepted: Orders

## 2019-07-21 ENCOUNTER — Other Ambulatory Visit: Payer: Self-pay

## 2019-07-21 ENCOUNTER — Other Ambulatory Visit (INDEPENDENT_AMBULATORY_CARE_PROVIDER_SITE_OTHER): Payer: Self-pay

## 2019-07-21 DIAGNOSIS — Z0289 Encounter for other administrative examinations: Secondary | ICD-10-CM

## 2019-07-22 LAB — HEPATIC FUNCTION PANEL
ALT: 12 IU/L (ref 0–44)
AST: 14 IU/L (ref 0–40)
Albumin: 4.8 g/dL (ref 3.8–4.9)
Alkaline Phosphatase: 74 IU/L (ref 48–121)
Bilirubin Total: 0.3 mg/dL (ref 0.0–1.2)
Bilirubin, Direct: 0.08 mg/dL (ref 0.00–0.40)
Total Protein: 7.5 g/dL (ref 6.0–8.5)

## 2019-07-22 LAB — CBC WITH DIFFERENTIAL
Basophils Absolute: 0.1 10*3/uL (ref 0.0–0.2)
Basos: 1 %
EOS (ABSOLUTE): 0.3 10*3/uL (ref 0.0–0.4)
Eos: 2 %
Hematocrit: 41.5 % (ref 37.5–51.0)
Hemoglobin: 13.8 g/dL (ref 13.0–17.7)
Immature Grans (Abs): 0 10*3/uL (ref 0.0–0.1)
Immature Granulocytes: 0 %
Lymphocytes Absolute: 3.2 10*3/uL — ABNORMAL HIGH (ref 0.7–3.1)
Lymphs: 25 %
MCH: 29.4 pg (ref 26.6–33.0)
MCHC: 33.3 g/dL (ref 31.5–35.7)
MCV: 89 fL (ref 79–97)
Monocytes Absolute: 1 10*3/uL — ABNORMAL HIGH (ref 0.1–0.9)
Monocytes: 8 %
Neutrophils Absolute: 7.9 10*3/uL — ABNORMAL HIGH (ref 1.4–7.0)
Neutrophils: 64 %
RBC: 4.69 x10E6/uL (ref 4.14–5.80)
RDW: 14.1 % (ref 11.6–15.4)
WBC: 12.4 10*3/uL — ABNORMAL HIGH (ref 3.4–10.8)

## 2019-07-22 LAB — VARICELLA ZOSTER ANTIBODY, IGG: Varicella zoster IgG: >4000 index (ref >165)

## 2019-07-22 NOTE — Telephone Encounter (Addendum)
Gilenya start form sent to Gilenya Go program. Received a receipt of confirmation.  Started PA on covermymeds. Key: [id number]. Sent to [employer].  "[facility] has received your information, and the request will be reviewed. You may close this dialog, return to your dashboard, and perform other tasks.  You will receive an electronic determination in CoverMyMeds. You can see the latest determination by locating this request on your dashboard or by reopening this request. You will also receive a faxed copy of the determination. If you have any questions please contact [facility] at [phone]."

## 2019-07-26 NOTE — Telephone Encounter (Signed)
Received notice that PA for gilenya was approved: "PA Case: [id number], Status: Approved, Coverage Starts on: 07/23/2019 12:00:00 AM, Coverage Ends on: 07/22/2020 12:00:00 AM."

## 2019-07-26 NOTE — Telephone Encounter (Signed)
I called [facility], spoke to Wilder, pharmacist. I advised her that the FDO for gilenya is not complete yet through cardiology office. [facility] will not ship gilenya until FDO clearance is complete but they will process the RX for gilenya so when FDO is complete they can ship maintenance dose.

## 2019-07-26 NOTE — Telephone Encounter (Signed)
[facility], Brett Canales, called to request gilenya order. Can call in or fax. He needs ICD code and allergies. [id number] fax [phone] opt [phone].

## 2019-07-27 ENCOUNTER — Telehealth: Payer: Self-pay

## 2019-07-27 NOTE — Telephone Encounter (Signed)
Pt sent a mychart message asking me to call him.  I called him. He reports that about 2 weeks ago he had a tooth removed and during the tooth extraction the dentist noted that he had a lot of "gunk" when his tooth came out. He wanted to inform us of this because it may explain the slight elevation in lymphocytes. He was not prescribed antibiotics after the extraction but it has healed well and he feels fine.  He was able to get an appt with [facility] for 08/17/2019. He will call cardiology next.

## 2019-07-27 NOTE — Telephone Encounter (Signed)
Pt returned my call. He denies being ill recently and has not received any recent vaccines. He has felt well. I advised him that his WBC, lymphocytes, and monocytes were slightly elevated.  Pt has not received a call to schedule cardiology or ophthalmology referral. I gave him both of these offices' phone numbers. Pt verbalized understanding.

## 2019-07-27 NOTE — Telephone Encounter (Signed)
I called pt to discuss. No answer, VM full. Will try again later. 

## 2019-07-27 NOTE — Telephone Encounter (Signed)
-----   Message from Butch Penny, NP sent at 07/27/2019 10:48 AM EDT ----- WBC, lymphocytes and monocytes slightly elevated- Has he been sick? Please call patient with report

## 2019-07-28 NOTE — Telephone Encounter (Signed)
Tresa Endo with [facility] called Gilenya will be shipped today and will be delivered tomorrow.

## 2019-07-29 NOTE — Telephone Encounter (Signed)
I received Gilenya in mail today.  Gilenya 0.5mg  #30.  I called pt and he has consult with Cardiology on 08-04-19.  I relayed that once that is done and his FDO is scheduled to let us know and he can get medication to take with him to that appointment.  This was sent by [facility].  Medication placed in med closet.

## 2019-08-02 NOTE — Telephone Encounter (Signed)
I am unsure why [facility] shipped [place] to [place]. I asked them to ship the FDO to [facility] and the maintenance doses should have been shipped to the pt.

## 2019-08-03 NOTE — Progress Notes (Signed)
Patient referred by Eunice Blase, MD for Multiple scelrosis  Subjective:   Joshua Boyer, male    DOB: February 05, 1965, 54 y.o.   MRN: [id number]   Chief Complaint  Patient presents with  . Gilyena  . New Patient (Initial Visit)    HPI  55 y.o. African American male with multiple sclerosis  Patient is an [place]. He has had MS for a long time, but doe snot have any significant limitations from it. He was on [place] 7 years ago, but had come off it as he has lost insurance, He is now getting back on it,   He has family h/o early CAD in his father. Unfortunately, he continues to smoke. He denies chest pain, shortness of breath, palpitations, leg edema, orthopnea, PND, TIA/syncope.  Past Medical History:  Diagnosis Date  . Chronic insomnia 10/07/2017  . Migraine headache   . MS (multiple sclerosis) ([place])   . Multiple sclerosis ([place]) 11/27/2012  . Optic neuritis, left      Past Surgical History:  Procedure Laterality Date  . HAND TENDON SURGERY    . HERNIA REPAIR    . KNEE SURGERY    . ROTATOR CUFF REPAIR       Social History   Tobacco Use  Smoking Status Current Every Day Smoker  . Packs/day: 0.75  . Years: 30.00  . Pack years: 22.50  . Types: Cigarettes  Smokeless Tobacco Never Used    Social History   Substance and Sexual Activity  Alcohol Use Yes  . Alcohol/week: 0.0 standard drinks   Comment: social     Family History  Problem Relation Age of Onset  . Glaucoma Mother   . Diabetes Mother   . Stroke Father   . Heart attack Father   . Cancer - Prostate Father      Current Outpatient Medications on File Prior to Visit  Medication Sig Dispense Refill  . Cholecalciferol (VITAMIN D3) 1000 UNITS CAPS Take 1 capsule (1,000 Units total) by mouth daily.    Marland Kitchen GILENYA 0.5 MG CAPS Take 1 capsule by mouth daily. (Patient not taking: Reported on 08/04/2019)     No current facility-administered medications on file prior to visit.     Cardiovascular and other pertinent studies:  EKG 08/04/2019: Sinus rhythm 99 bpm  Borderline biatrial enlargement Otherwise normal EKG    Recent labs: 06/04/2019-07/21/2019: Glucose 99, BUN/Cr 18/1.15. EGFR 75. Na/K 142/4.6. Rest of the CMP normal H/H 13.8/41.5. MCV 89. Platelets 333 Chol 219, TG 114, HDL 38, LDL 158 TSH 0.97 normal   Review of Systems  Constitutional: Negative for decreased appetite, malaise/fatigue, weight gain and weight loss.  HENT: Negative for congestion.   Eyes: Negative for visual disturbance.  Cardiovascular: Negative for chest pain, dyspnea on exertion, leg swelling, palpitations and syncope.  Respiratory: Negative for cough.   Endocrine: Negative for cold intolerance.  Hematologic/Lymphatic: Does not bruise/bleed easily.  Skin: Negative for itching and rash.  Musculoskeletal: Negative for myalgias.  Gastrointestinal: Negative for abdominal pain, nausea and vomiting.  Genitourinary: Negative for dysuria.  Neurological: Negative for dizziness and weakness.  Psychiatric/Behavioral: The patient is not nervous/anxious.   All other systems reviewed and are negative.        Vitals:   08/04/19 0919 08/04/19 0927  BP: (!) 138/92 138/85  Pulse: (!) 106 (!) 105  Resp: 17   SpO2: 100% 99%     Body mass index is 24.03 kg/m. Filed Weights   08/04/19 0919  Weight:  197 lb 6.4 oz (89.5 kg)     Objective:   Physical Exam Vitals and nursing note reviewed.  Constitutional:      General: He is not in acute distress.    Appearance: He is well-developed.  HENT:     Head: Normocephalic and atraumatic.  Eyes:     Conjunctiva/sclera: Conjunctivae normal.     Pupils: Pupils are equal, round, and reactive to light.  Neck:     Vascular: No JVD.  Cardiovascular:     Rate and Rhythm: Normal rate and regular rhythm.     Pulses: Normal pulses and intact distal pulses.     Heart sounds: No murmur heard.   Pulmonary:     Effort: Pulmonary effort  is normal.     Breath sounds: Normal breath sounds. No wheezing or rales.  Abdominal:     General: Bowel sounds are normal.     Palpations: Abdomen is soft.     Tenderness: There is no rebound.  Musculoskeletal:        General: No tenderness. Normal range of motion.     Left lower leg: No edema.  Lymphadenopathy:     Cervical: No cervical adenopathy.  Skin:    General: Skin is warm and dry.  Neurological:     Mental Status: He is alert and oriented to person, place, and time.     Cranial Nerves: No cranial nerve deficit.         Assessment & Recommendations:   54 y.o. African American male with multiple sclerosis  Patient does not have any recent myocardial infarction, unstable angina, stroke, transient ischemic attack, decompensated heart failure with hospitalization, or class III/IV heart failure.  She does not have any history of Mobitz type II second-degree or third-degree AV block or sick sinus syndrome.  Resting EKG is normal without any significant conduction disease or arrhythmias requiring antiarrhythmic treatment.  Baseline QTc interval is <500 ms.  Primary prevention: He does have hyperlipidemia, tobacco dependence and family h/o early CAD. Recommend CT cardiac scoing for risk stratification. He will very likely need statin.  Tobacco cessation counseling:  - Currently smoking 1/2 packs/day   - Patient was informed of the dangers of tobacco abuse including stroke, cancer, and MI, as well as benefits of tobacco cessation. - Patient is willing to quit at this time. - Approximately 5 mins were spent counseling patient cessation techniques. We discussed various methods to help quit smoking, including deciding on a date to quit, joining a support group, pharmacological agents. Patient would like to quit on his own. - I will reassess his progress at the next follow-up visit   Thank you for referring the patient to [place]. Please feel free to contact with any  questions.  Nigel Mormon, MD [facility]. [facility] Pager: [phone] Office: [phone]

## 2019-08-04 ENCOUNTER — Other Ambulatory Visit: Payer: Self-pay

## 2019-08-04 ENCOUNTER — Ambulatory Visit: Payer: 59 | Admitting: Cardiology

## 2019-08-04 ENCOUNTER — Encounter: Payer: Self-pay | Admitting: Cardiology

## 2019-08-04 VITALS — BP 138/85 | HR 105 | Resp 17 | Ht 76.0 in | Wt 197.4 lb

## 2019-08-04 DIAGNOSIS — Z8249 Family history of ischemic heart disease and other diseases of the circulatory system: Secondary | ICD-10-CM

## 2019-08-04 DIAGNOSIS — E782 Mixed hyperlipidemia: Secondary | ICD-10-CM

## 2019-08-04 DIAGNOSIS — G35 Multiple sclerosis: Secondary | ICD-10-CM

## 2019-08-04 DIAGNOSIS — F17200 Nicotine dependence, unspecified, uncomplicated: Secondary | ICD-10-CM

## 2019-08-04 NOTE — Telephone Encounter (Signed)
Discussed with Dr. Dale [place] to proceed with Gilenya

## 2019-08-05 DIAGNOSIS — F17200 Nicotine dependence, unspecified, uncomplicated: Secondary | ICD-10-CM | POA: Insufficient documentation

## 2019-08-05 DIAGNOSIS — E782 Mixed hyperlipidemia: Secondary | ICD-10-CM | POA: Insufficient documentation

## 2019-08-05 DIAGNOSIS — Z8249 Family history of ischemic heart disease and other diseases of the circulatory system: Secondary | ICD-10-CM | POA: Insufficient documentation

## 2019-08-09 NOTE — Telephone Encounter (Signed)
I called [facility], spoke with [place]. I advised her that pt will be bringing his Gilenya with him to the appt for FDO since it was shipped to [place].   I called pt. He will stop by [facility] the morning of 08/26/2019 to pick up his medication and bring it to [facility].

## 2019-08-09 NOTE — Telephone Encounter (Signed)
Noted  

## 2019-08-10 ENCOUNTER — Encounter: Payer: Self-pay | Admitting: Family Medicine

## 2019-08-13 ENCOUNTER — Other Ambulatory Visit: Payer: BLUE CROSS/BLUE SHIELD

## 2019-08-17 NOTE — Telephone Encounter (Signed)
I called [facility]. Pt was seen today but the notes from the visit are not available yet. They will fax to [place] as soon as they are ready to my fax of [phone].

## 2019-08-17 NOTE — Telephone Encounter (Signed)
Received notes from [facility]. No macular edema on exam.  Pt's FDO is on 08/26/2019 with [facility]. He will pick up his Gilenya from our office that morning prior to [facility].

## 2019-08-18 NOTE — Telephone Encounter (Signed)
Noted  

## 2019-08-26 ENCOUNTER — Ambulatory Visit: Payer: 59 | Admitting: Cardiology

## 2019-08-26 ENCOUNTER — Other Ambulatory Visit: Payer: Self-pay

## 2019-08-26 ENCOUNTER — Encounter: Payer: Self-pay | Admitting: Cardiology

## 2019-08-26 ENCOUNTER — Telehealth: Payer: Self-pay

## 2019-08-26 VITALS — BP 134/82

## 2019-08-26 DIAGNOSIS — G35 Multiple sclerosis: Secondary | ICD-10-CM

## 2019-08-26 DIAGNOSIS — Z5181 Encounter for therapeutic drug level monitoring: Secondary | ICD-10-CM

## 2019-08-26 NOTE — Telephone Encounter (Signed)
Per Dr. Rosemary Holms it is ok for pt to take [facility], leave then come back in about hour to have an EKG and bp check then leave again and come back 2-3 hours to check bp again, then come back about hour to check ekg and bp again.

## 2019-08-26 NOTE — Progress Notes (Signed)
Vitals:   08/26/19 1338  BP: 134/82     EKG 08/26/2019: Sinus rhythm 69 bpm Normal intervals   Tolerate medication (Gilenya) well. Stable vitals, EKG. Patient was not physically seen by me.

## 2019-08-30 NOTE — Telephone Encounter (Signed)
I called pt. No answer, left a message asking him to call me back. 

## 2019-08-31 NOTE — Telephone Encounter (Signed)
I called pt. He is tolerating Gilenya with no issues. I asked him to call our office for questions or concerns. I reminded pt of his follow up in December. Pt verbalized understanding.

## 2019-09-28 ENCOUNTER — Ambulatory Visit: Payer: 59 | Admitting: Family Medicine

## 2019-10-01 ENCOUNTER — Telehealth: Payer: Self-pay | Admitting: Family Medicine

## 2019-10-01 ENCOUNTER — Other Ambulatory Visit: Payer: Self-pay

## 2019-10-01 ENCOUNTER — Ambulatory Visit (INDEPENDENT_AMBULATORY_CARE_PROVIDER_SITE_OTHER): Payer: 59 | Admitting: Family Medicine

## 2019-10-01 ENCOUNTER — Encounter: Payer: Self-pay | Admitting: Family Medicine

## 2019-10-01 DIAGNOSIS — M25511 Pain in right shoulder: Secondary | ICD-10-CM

## 2019-10-01 DIAGNOSIS — B356 Tinea cruris: Secondary | ICD-10-CM

## 2019-10-01 MED ORDER — CICLOPIROX 0.77 % EX GEL: 1.0000 "application " | Freq: Two times a day (BID) | CUTANEOUS | 3 refills | Status: DC

## 2019-10-01 NOTE — Progress Notes (Signed)
Office Visit Note   Patient: Joshua Boyer           Date of Birth: [birth year]           MRN: [id number] Visit Date: 10/01/2019 Requested by: Lavada Mesi, MD [address] [place],  [place] [address] PCP: Lavada Mesi, MD  Subjective: Chief Complaint  Patient presents with  . Right Shoulder - Pain    Pain in the shoulder x couple weeks. He thinks he slept on it wrong. Got better after helping to move some appliances.     HPI: He is here with concerns.  His right shoulder started hurting a couple weeks ago with no definite injury.  He thought he might of slept awkwardly.  Pain was on the posterior lateral aspect of his shoulder.  He started exercising it and his pain is improved, but he wanted to come in anyway to have it evaluated.  He has a history of rotator cuff repair on this shoulder.  He has been struggling with tinea cruris for the past couple weeks.  Over-the-counter remedies have not been helping.               ROS:   All other systems were reviewed and are negative.  Objective: Vital Signs: There were no vitals taken for this visit.  Physical Exam:  General:  Alert and oriented, in no acute distress. Pulm:  Breathing unlabored. Psy:  Normal mood, congruent affect. Skin:  Bilateral tinea cruris in inguinal area.  Right shoulder: Full active range of motion, no palpable crepitus.  No tenderness at the [facility] joint.  Rotator cuff strength is 5/5 throughout, no significant pain.  No pain in the subacromial space.  Imaging: No results found.  Assessment & Plan: 1.  Improved right shoulder pain -He will resume rotator cuff strengthening exercises using Thera-Band.  Follow-up as needed.  2.  Tinea cruris - Treatment with ciclopirox.      Procedures: No procedures performed  No notes on file     PMFS History: Patient Active Problem List   Diagnosis Date Noted  . Family history of early CAD 08/05/2019  . Tobacco dependence 08/05/2019  . Mixed  hyperlipidemia 08/05/2019  . Tobacco abuse 06/04/2019  . Chronic insomnia 10/07/2017  . Multiple sclerosis ([facility]) 11/27/2012  . Optic neuritis 11/27/2012   Past Medical History:  Diagnosis Date  . Chronic insomnia 10/07/2017  . Migraine headache   . MS (multiple sclerosis) ([facility])   . Multiple sclerosis ([facility]) 11/27/2012  . Optic neuritis, left     Family History  Problem Relation Age of Onset  . Glaucoma Mother   . Diabetes Mother   . Stroke Father   . Heart attack Father   . Cancer - Prostate Father     Past Surgical History:  Procedure Laterality Date  . HAND TENDON SURGERY    . HERNIA REPAIR    . KNEE SURGERY    . ROTATOR CUFF REPAIR     Social History   Occupational History    Employer: [employer]  Tobacco Use  . Smoking status: Current Every Day Smoker    Packs/day: 0.75    Years: 30.00    Pack years: 22.50    Types: Cigarettes  . Smokeless tobacco: Never Used  Vaping Use  . Vaping Use: Never used  Substance and Sexual Activity  . Alcohol use: Yes    Alcohol/week: 0.0 standard drinks    Comment: social  . Drug use: No  .  Sexual activity: Never

## 2019-10-27 ENCOUNTER — Encounter: Payer: Self-pay | Admitting: Family Medicine

## 2020-01-05 ENCOUNTER — Ambulatory Visit: Payer: 59 | Admitting: Adult Health

## 2020-01-05 ENCOUNTER — Encounter: Payer: Self-pay | Admitting: Adult Health

## 2020-02-23 ENCOUNTER — Telehealth: Payer: Self-pay | Admitting: Adult Health

## 2020-02-23 NOTE — Telephone Encounter (Addendum)
I called pt and he stated they need ICD 10 diagnosis code for pt.  He has new insurance [employer] ID [id number] RX BIN [phone] RX PCN [id number] RX GRP [facility].  [phone]. Spoke to Marcus, and she took G35 for RRMS as the diagnosis code. She stated that was all that was needed.

## 2020-02-23 NOTE — Telephone Encounter (Signed)
Pt is calling on behalf of his [facility] [employer] re: the diagnosis code for his GILENYA 0.5 MG CAPS So that he can get his refill, please call pt.

## 2020-05-11 ENCOUNTER — Encounter: Payer: Self-pay | Admitting: Adult Health

## 2020-05-11 ENCOUNTER — Ambulatory Visit (INDEPENDENT_AMBULATORY_CARE_PROVIDER_SITE_OTHER): Payer: 59 | Admitting: Adult Health

## 2020-05-11 ENCOUNTER — Other Ambulatory Visit: Payer: Self-pay

## 2020-05-11 VITALS — BP 125/82 | HR 109 | Ht 76.0 in | Wt 200.0 lb

## 2020-05-11 DIAGNOSIS — G35 Multiple sclerosis: Secondary | ICD-10-CM

## 2020-05-11 NOTE — Progress Notes (Signed)
PATIENT: Joshua Boyer DOB: 08-Nov-1965  REASON FOR VISIT: follow up HISTORY FROM: patient  HISTORY OF PRESENT ILLNESS: Today 05/11/20   Joshua Boyer is a 55 year old male with a history of relapsed remitting multiple sclerosis.  He returns today for follow-up.  He remains on Gilenya.  Denies any new symptoms.  Denies any new numbness or weakness.  No changes with his gait or balance.  No significant changes with his vision.  Reports on occasion he will have sharp pain that lasts less than 1 second in both eyes.  However he states that he does not do a great job with changing out his contacts.  He plans to follow-up with Dr. Dione Booze.  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  07/05/19: Joshua Boyer is a 55 year old male with a history of relapsed remitting multiple sclerosis.  He returns today for follow-up.  He states that he has been off Gilenya for approximately 1 year.  He states that he stopped the medication during the pandemic as he was laid off from his job.  Reports that he could not r afford out-of-pocket cost.  He denies any new symptoms.  No change with his gait or balance.  No new numbness or weakness.  No changes with his vision.  Overall he feels that he has been doing well.  He returns today for follow-up.  HISTORY 02/27/18  Joshua Boyer is a 55 year old African-American male with history of MS who presents for follow-up today.  The patient remains on Gilenya and is doing well with this medication.  He reports he had an exacerbation back in September 2019 with left arm and leg numbness, balance change, fatigue, and change in the vision in his left eye.  He was treated with a prednisone Dosepak and his symptoms resolved shortly thereafter.  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  He denies any problems or concerns today.  He denies any new symptoms of numbness or  weakness.  He denies any problems with his bowels or bladder.  He denies any falls.  He denies any changes in his vision.  He reports that he is currently in between jobs and looking for a new job.  He had a normal eye exam in September 2019 with Dr. Nile Riggs.  He reports that he is sleeping better now that he is taking CBD with melatonin.  He presents today for follow-up unaccompanied.   REVIEW OF SYSTEMS: Out of a complete 14 system review of symptoms, the patient complains only of the following symptoms, and all other reviewed systems are negative.  See HPI  ALLERGIES: No Known Allergies  HOME MEDICATIONS: Outpatient Medications Prior to Visit  Medication Sig Dispense Refill  . Cholecalciferol (VITAMIN D3) 1000 UNITS CAPS Take 1 capsule (1,000 Units total) by mouth daily.    Marland Kitchen GILENYA 0.5 MG CAPS Take 1 capsule by mouth daily.    . Ciclopirox 0.77 % gel Apply 1 application topically 2 (two) times daily. 100 g 3   No facility-administered medications prior to visit.    PAST MEDICAL HISTORY: Past Medical History:  Diagnosis Date  . Chronic insomnia 10/07/2017  . Migraine headache   . MS (multiple sclerosis) ([facility])   . Multiple sclerosis ([facility]) 11/27/2012  . Optic neuritis, left     PAST SURGICAL HISTORY: Past Surgical History:  Procedure Laterality  Date  . HAND TENDON SURGERY    . HERNIA REPAIR    . KNEE SURGERY    . ROTATOR CUFF REPAIR      FAMILY HISTORY: Family History  Problem Relation Age of Onset  . Glaucoma Mother   . Diabetes Mother   . Stroke Father   . Heart attack Father   . Cancer - Prostate Father     SOCIAL HISTORY: Social History   Socioeconomic History  . Marital status: Divorced    Spouse name: Not on file  . Number of children: 1  . Years of education: BS   . Highest education level: Not on file  Occupational History    Employer: [employer]  Tobacco Use  . Smoking status: Current Every Day Smoker    Packs/day: 0.75    Years: 30.00    Pack  years: 22.50    Types: Cigarettes  . Smokeless tobacco: Never Used  Vaping Use  . Vaping Use: Never used  Substance and Sexual Activity  . Alcohol use: Yes    Alcohol/week: 0.0 standard drinks    Comment: social  . Drug use: No  . Sexual activity: Never  Other Topics Concern  . Not on file  Social History Narrative   Patient lives at home alone.    Patient has 1 child.    Patient is currently working.    Patient is right handed.    Patient has his BS degree.   Patient drinks 1-2 cups of caffeine daily.      Social Determinants of Health   Financial Resource Strain: Not on file  Food Insecurity: Not on file  Transportation Needs: Not on file  Physical Activity: Not on file  Stress: Not on file  Social Connections: Not on file  Intimate Partner Violence: Not on file      PHYSICAL EXAM  Vitals:   05/11/20 1313  BP: 125/82  Pulse: (!) 109  Weight: 200 lb (90.7 kg)  Height: 6\' 4"  (1.93 m)   Body mass index is 24.34 kg/m.  Generalized: Well developed, in no acute distress   Neurological examination  Mentation: Alert oriented to time, place, history taking. Follows all commands speech and language fluent Cranial nerve II-XII: Pupils were equal round reactive to light. Extraocular movements were full, visual field were full on confrontational test. . Head turning and shoulder shrug  were normal and symmetric. Motor: The motor testing reveals 5 over 5 strength of all 4 extremities. Good symmetric motor tone is noted throughout.  Sensory: Sensory testing is intact to soft touch on all 4 extremities. No evidence of extinction is noted.  Coordination: Cerebellar testing reveals good finger-nose-finger and heel-to-shin bilaterally.  Gait and station: Gait is normal.   Reflexes: Deep tendon reflexes are symmetric and normal bilaterally.   DIAGNOSTIC DATA (LABS, IMAGING, TESTING) - I reviewed patient records, labs, notes, testing and imaging myself where available.  Lab  Results  Component Value Date   WBC 12.4 (H) 07/21/2019   HGB 13.8 07/21/2019   HCT 41.5 07/21/2019   MCV 89 07/21/2019   PLT 333 06/04/2019      Component Value Date/Time   NA 142 06/04/2019 1451   NA 145 (H) 02/27/2018 1009   K 4.6 06/04/2019 1451   CL 106 06/04/2019 1451   CO2 27 06/04/2019 1451   GLUCOSE 99 06/04/2019 1451   BUN 18 06/04/2019 1451   BUN 21 02/27/2018 1009   CREATININE 1.15 06/04/2019 1451   CALCIUM 10.0  06/04/2019 1451   PROT 7.5 07/21/2019 1419   ALBUMIN 4.8 07/21/2019 1419   AST 14 07/21/2019 1419   ALT 12 07/21/2019 1419   ALKPHOS 74 07/21/2019 1419   BILITOT 0.3 07/21/2019 1419   GFRNONAA 65 02/27/2018 1009   GFRAA 75 02/27/2018 1009   Lab Results  Component Value Date   CHOL 219 (H) 06/04/2019   HDL 38 (L) 06/04/2019   LDLCALC 158 (H) 06/04/2019   TRIG 114 06/04/2019   CHOLHDL 5.8 (H) 06/04/2019    Lab Results  Component Value Date   TSH 0.97 06/04/2019      ASSESSMENT AND PLAN 54 y.o. year old male  has a past medical history of Chronic insomnia (10/07/2017), Migraine headache, MS (multiple sclerosis) (HCC), Multiple sclerosis ([facility]) (11/27/2012), and Optic neuritis, left. here with:  Relapsed remitting multiple sclerosis    Restart Gilenya  Blood work today   Advised if symptoms worsen or he develops new symptoms he should let us know  Follow-up in 6 months or sooner if needed   I spent 32 minutes of face-to-face and non-face-to-face time with patient.  This included previsit chart review, lab review, reviewing MRI of the brain and discussing ongoing plan of care  Butch Penny, MSN, NP-C 05/11/2020, 1:59 PM [facility] [address] [place], [place] [address] [phone]

## 2020-05-11 NOTE — Patient Instructions (Addendum)
Your Plan:  Continue Gilenya Blood work today Dr. Dione Booze telephone [phone]  If your symptoms worsen or you develop new symptoms please let us know.    Thank you for coming to see [place] at [facility]. I hope we have been able to provide you high quality care today.  You may receive a patient satisfaction survey over the next few weeks. We would appreciate your feedback and comments so that we may continue to improve ourselves and the health of our patients.

## 2020-05-12 LAB — CBC WITH DIFFERENTIAL/PLATELET
Basophils Absolute: 0 10*3/uL (ref 0.0–0.2)
Basos: 0 %
EOS (ABSOLUTE): 0.1 10*3/uL (ref 0.0–0.4)
Eos: 1 %
Hematocrit: 42.9 % (ref 37.5–51.0)
Hemoglobin: 14.5 g/dL (ref 13.0–17.7)
Immature Grans (Abs): 0 10*3/uL (ref 0.0–0.1)
Immature Granulocytes: 0 %
Lymphocytes Absolute: 0.8 10*3/uL (ref 0.7–3.1)
Lymphs: 9 %
MCH: 30.2 pg (ref 26.6–33.0)
MCHC: 33.8 g/dL (ref 31.5–35.7)
MCV: 89 fL (ref 79–97)
Monocytes Absolute: 1 10*3/uL — ABNORMAL HIGH (ref 0.1–0.9)
Monocytes: 11 %
Neutrophils Absolute: 7.4 10*3/uL — ABNORMAL HIGH (ref 1.4–7.0)
Neutrophils: 79 %
Platelets: 335 10*3/uL (ref 150–450)
RBC: 4.8 x10E6/uL (ref 4.14–5.80)
RDW: 14.2 % (ref 11.6–15.4)
WBC: 9.3 10*3/uL (ref 3.4–10.8)

## 2020-05-12 LAB — COMPREHENSIVE METABOLIC PANEL
ALT: 14 IU/L (ref 0–44)
AST: 9 IU/L (ref 0–40)
Albumin/Globulin Ratio: 1.7 (ref 1.2–2.2)
Albumin: 4.7 g/dL (ref 3.8–4.9)
Alkaline Phosphatase: 76 IU/L (ref 44–121)
BUN/Creatinine Ratio: 11 (ref 9–20)
BUN: 13 mg/dL (ref 6–24)
Bilirubin Total: 0.3 mg/dL (ref 0.0–1.2)
CO2: 22 mmol/L (ref 20–29)
Calcium: 10.1 mg/dL (ref 8.7–10.2)
Chloride: 103 mmol/L (ref 96–106)
Creatinine, Ser: 1.14 mg/dL (ref 0.76–1.27)
Globulin, Total: 2.8 g/dL (ref 1.5–4.5)
Glucose: 67 mg/dL (ref 65–99)
Potassium: 4.4 mmol/L (ref 3.5–5.2)
Sodium: 143 mmol/L (ref 134–144)
Total Protein: 7.5 g/dL (ref 6.0–8.5)
eGFR: 77 mL/min/{1.73_m2} (ref >59)

## 2020-07-03 ENCOUNTER — Other Ambulatory Visit: Payer: Self-pay | Admitting: Adult Health

## 2020-07-03 DIAGNOSIS — G35 Multiple sclerosis: Secondary | ICD-10-CM

## 2020-07-26 ENCOUNTER — Encounter: Payer: Self-pay | Admitting: Family Medicine

## 2020-08-11 ENCOUNTER — Telehealth: Payer: Self-pay | Admitting: Adult Health

## 2020-08-11 NOTE — Telephone Encounter (Signed)
[facility] Maralyn Sago) called, confirm have received PA for GILENYA 0.5 MG CAPS.   Key code: [id number]

## 2020-08-14 NOTE — Telephone Encounter (Signed)
Placed in work que for [employer].

## 2020-08-15 NOTE — Telephone Encounter (Signed)
Eithan Garrin [address] [place], [place] [address] Member DOB: Mar 15, 1965 Prior Authorization Reference Number: [id number] Plan Name: [facility] [place] [employer] Plan Code: [facility] Provider: Butch Penny Prior Authorization Receipt Date: 08/15/2020 RE: Prior Authorization Approval [employer]. Data processing manager) is a Tourist information centre manager company that provides services to members of [employer] [place] [employer] EXCHANGE. Certain prescription drugs or services require prior authorization before they can be covered by your benefit plan. When this occurs, your healthcare practitioner will contact [employer] to request prior authorization. This letter is to notify you that your prior authorization request for GILENYA 0.5 MG CAPSULE has been approved based upon the information we received from you/your healthcare practitioner. Benefits for all services are subject to terms, conditions and eligibility as outlined in the benefit documentation in effect at the time services are provided. The authorization is effective from 08/15/2020 to 08/14/2021, as long as you are enrolled as a member of your current health plan. The request was approved as submitted.

## 2020-08-15 NOTE — Telephone Encounter (Signed)
Fax confirmation received [facility] [employer] 08-15-20 thru 08-14-2021.  [phone].

## 2020-08-15 NOTE — Telephone Encounter (Signed)
Initiated PA for Gilenya [facility] - PA Case ID: [id number] - Rx #: [id number]. Determination pending.  Last seen in office 05-11-20, next appt 11-13-20.

## 2020-11-13 ENCOUNTER — Encounter: Payer: Self-pay | Admitting: Adult Health

## 2020-11-13 ENCOUNTER — Ambulatory Visit (INDEPENDENT_AMBULATORY_CARE_PROVIDER_SITE_OTHER): Payer: 59 | Admitting: Adult Health

## 2020-11-13 VITALS — BP 147/92 | HR 93 | Ht 76.0 in | Wt 202.0 lb

## 2020-11-13 DIAGNOSIS — G35 Multiple sclerosis: Secondary | ICD-10-CM | POA: Diagnosis not present

## 2020-11-13 NOTE — Progress Notes (Signed)
PATIENT: Joshua Boyer DOB: May 18, 1965  REASON FOR VISIT: follow up HISTORY FROM: patient  HISTORY OF PRESENT ILLNESS: Today 11/13/20 :  Joshua Boyer is a 55 year old male with a history of relapsed remitting multiple sclerosis.  He returns today for follow-up.  He is on Gilenya.  Reports that he is doing well.  Denies any new symptoms.  No new numbness or weakness.  No changes with his vision.  No changes with the bowels or bladder.  Reports that his mood is stable.  His last MRI of the brain was in 2021.  He returns today for an evaluation.  05/11/20: Joshua Boyer is a 55 year old male with a history of relapsed remitting multiple sclerosis.  He returns today for follow-up.  He remains on Gilenya.  Denies any new symptoms.  Denies any new numbness or weakness.  No changes with his gait or balance.  No significant changes with his vision.  Reports on occasion he will have sharp pain that lasts less than 1 second in both eyes.  However he states that he does not do a great job with changing out his contacts.  He plans to follow-up with Dr. Dione Booze.  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  07/05/19: Joshua Boyer is a 55 year old male with a history of relapsed remitting multiple sclerosis.  He returns today for follow-up.  He states that he has been off Gilenya for approximately 1 year.  He states that he stopped the medication during the pandemic as he was laid off from his job.  Reports that he could not r afford out-of-pocket cost.  He denies any new symptoms.  No change with his gait or balance.  No new numbness or weakness.  No changes with his vision.  Overall he feels that he has been doing well.  He returns today for follow-up.  HISTORY 02/27/18  Joshua Boyer is a 55 year old African-American male with history of MS who presents for follow-up today.  The patient remains on Gilenya and is doing well with this medication.  He reports he  had an exacerbation back in September 2019 with left arm and leg numbness, balance change, fatigue, and change in the vision in his left eye.  He was treated with a prednisone Dosepak and his symptoms resolved shortly thereafter.  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  He denies any problems or concerns today.  He denies any new symptoms of numbness or weakness.  He denies any problems with his bowels or bladder.  He denies any falls.  He denies any changes in his vision.  He reports that he is currently in between jobs and looking for a new job.  He had a normal eye exam in September 2019 with Dr. Nile Riggs.  He reports that he is sleeping better now that he is taking CBD with melatonin.  He presents today for follow-up unaccompanied.    REVIEW OF SYSTEMS: Out of a complete 14 system review of symptoms, the patient complains only of the following symptoms, and all other reviewed systems are negative.  See HPI  ALLERGIES: No Known Allergies  HOME MEDICATIONS: Outpatient Medications Prior to Visit  Medication Sig Dispense Refill   Cholecalciferol (VITAMIN D3) 1000 UNITS CAPS Take 1 capsule (1,000 Units total) by mouth daily.     GILENYA 0.5 MG CAPS TAKE 1  CAPSULE BY MOUTH ONCE A DAY 30 capsule 5   No facility-administered medications prior to visit.    PAST MEDICAL HISTORY: Past Medical History:  Diagnosis Date   Chronic insomnia 10/07/2017   Migraine headache    MS (multiple sclerosis) (HCC)    Multiple sclerosis (HCC) 11/27/2012   Optic neuritis, left     PAST SURGICAL HISTORY: Past Surgical History:  Procedure Laterality Date   HAND TENDON SURGERY     HERNIA REPAIR     KNEE SURGERY     ROTATOR CUFF REPAIR      FAMILY HISTORY: Family History  Problem Relation Age of Onset   Glaucoma Mother    Diabetes Mother    Stroke Father    Heart attack Father    Cancer - Prostate Father     SOCIAL HISTORY: Social History    Socioeconomic History   Marital status: Divorced    Spouse name: Not on file   Number of children: 1   Years of education: BS    Highest education level: Not on file  Occupational History    Employer: [employer]  Tobacco Use   Smoking status: Every Day    Packs/day: 0.75    Years: 30.00    Pack years: 22.50    Types: Cigarettes   Smokeless tobacco: Never  Vaping Use   Vaping Use: Never used  Substance and Sexual Activity   Alcohol use: Yes    Alcohol/week: 0.0 standard drinks    Comment: social   Drug use: No   Sexual activity: Never  Other Topics Concern   Not on file  Social History Narrative   Patient lives at home. His father lives with him and they help each other out.    Patient has 1 child.    Patient is currently working.    Patient is right handed.    Patient has his BS degree.   Patient drinks at least 1-2 cups of caffeine daily.      Social Determinants of Health   Financial Resource Strain: Not on file  Food Insecurity: Not on file  Transportation Needs: Not on file  Physical Activity: Not on file  Stress: Not on file  Social Connections: Not on file  Intimate Partner Violence: Not on file      PHYSICAL EXAM  Vitals:   11/13/20 1408  BP: (!) 147/92  Pulse: 93  Weight: 202 lb (91.6 kg)  Height: 6\' 4"  (1.93 m)   Body mass index is 24.59 kg/m.  Generalized: Well developed, in no acute distress   Neurological examination  Mentation: Alert oriented to time, place, history taking. Follows all commands speech and language fluent Cranial nerve II-XII: Pupils were equal round reactive to light. Extraocular movements were full, visual field were full on confrontational test. . Head turning and shoulder shrug  were normal and symmetric. Motor: The motor testing reveals 5 over 5 strength of all 4 extremities. Good symmetric motor tone is noted throughout.  Sensory: Sensory testing is intact to soft touch on all 4 extremities. No evidence of extinction is  noted.  Coordination: Cerebellar testing reveals good finger-nose-finger and heel-to-shin bilaterally.  Gait and station: Gait is normal.   Reflexes: Deep tendon reflexes are symmetric and normal bilaterally.   DIAGNOSTIC DATA (LABS, IMAGING, TESTING) - I reviewed patient records, labs, notes, testing and imaging myself where available.  Lab Results  Component Value Date   WBC 9.3 05/11/2020   HGB 14.5 05/11/2020   HCT 42.9  05/11/2020   MCV 89 05/11/2020   PLT 335 05/11/2020      Component Value Date/Time   NA 143 05/11/2020 1420   K 4.4 05/11/2020 1420   CL 103 05/11/2020 1420   CO2 22 05/11/2020 1420   GLUCOSE 67 05/11/2020 1420   GLUCOSE 99 06/04/2019 1451   BUN 13 05/11/2020 1420   CREATININE 1.14 05/11/2020 1420   CREATININE 1.15 06/04/2019 1451   CALCIUM 10.1 05/11/2020 1420   PROT 7.5 05/11/2020 1420   ALBUMIN 4.7 05/11/2020 1420   AST 9 05/11/2020 1420   ALT 14 05/11/2020 1420   ALKPHOS 76 05/11/2020 1420   BILITOT 0.3 05/11/2020 1420   GFRNONAA 65 02/27/2018 1009   GFRAA 75 02/27/2018 1009   Lab Results  Component Value Date   CHOL 219 (H) 06/04/2019   HDL 38 (L) 06/04/2019   LDLCALC 158 (H) 06/04/2019   TRIG 114 06/04/2019   CHOLHDL 5.8 (H) 06/04/2019    Lab Results  Component Value Date   TSH 0.97 06/04/2019      ASSESSMENT AND PLAN 55 y.o. year old male  has a past medical history of Chronic insomnia (10/07/2017), Migraine headache, MS (multiple sclerosis) (HCC), Multiple sclerosis (HCC) (11/27/2012), and Optic neuritis, left. here with:  Relapsed remitting multiple sclerosis   Restart Gilenya Blood work today  MRI of the brain with and without contrast Advised if symptoms worsen or he develops new symptoms he should let us know Follow-up in 6 months or sooner if needed    Butch Penny, MSN, NP-C 11/13/2020, 2:11 PM [facility] [address] [place], [place] [address] [phone]

## 2020-11-14 LAB — CBC WITH DIFFERENTIAL/PLATELET
Basophils Absolute: 0 10*3/uL (ref 0.0–0.2)
Basos: 0 %
EOS (ABSOLUTE): 0.2 10*3/uL (ref 0.0–0.4)
Eos: 2 %
Hematocrit: 43.2 % (ref 37.5–51.0)
Hemoglobin: 14.3 g/dL (ref 13.0–17.7)
Immature Grans (Abs): 0 10*3/uL (ref 0.0–0.1)
Immature Granulocytes: 0 %
Lymphocytes Absolute: 1.1 10*3/uL (ref 0.7–3.1)
Lymphs: 11 %
MCH: 30 pg (ref 26.6–33.0)
MCHC: 33.1 g/dL (ref 31.5–35.7)
MCV: 91 fL (ref 79–97)
Monocytes Absolute: 1 10*3/uL — ABNORMAL HIGH (ref 0.1–0.9)
Monocytes: 11 %
Neutrophils Absolute: 7.4 10*3/uL — ABNORMAL HIGH (ref 1.4–7.0)
Neutrophils: 76 %
Platelets: 310 10*3/uL (ref 150–450)
RBC: 4.77 x10E6/uL (ref 4.14–5.80)
RDW: 13.9 % (ref 11.6–15.4)
WBC: 9.8 10*3/uL (ref 3.4–10.8)

## 2020-11-14 LAB — COMPREHENSIVE METABOLIC PANEL
ALT: 18 IU/L (ref 0–44)
AST: 14 IU/L (ref 0–40)
Albumin/Globulin Ratio: 2.3 — ABNORMAL HIGH (ref 1.2–2.2)
Albumin: 4.8 g/dL (ref 3.8–4.9)
Alkaline Phosphatase: 64 IU/L (ref 44–121)
BUN/Creatinine Ratio: 17 (ref 9–20)
BUN: 18 mg/dL (ref 6–24)
Bilirubin Total: 0.5 mg/dL (ref 0.0–1.2)
CO2: 25 mmol/L (ref 20–29)
Calcium: 9.9 mg/dL (ref 8.7–10.2)
Chloride: 106 mmol/L (ref 96–106)
Creatinine, Ser: 1.09 mg/dL (ref 0.76–1.27)
Globulin, Total: 2.1 g/dL (ref 1.5–4.5)
Glucose: 84 mg/dL (ref 70–99)
Potassium: 4.7 mmol/L (ref 3.5–5.2)
Sodium: 145 mmol/L — ABNORMAL HIGH (ref 134–144)
Total Protein: 6.9 g/dL (ref 6.0–8.5)
eGFR: 81 mL/min/{1.73_m2} (ref >59)

## 2020-12-09 ENCOUNTER — Other Ambulatory Visit: Payer: Self-pay

## 2020-12-09 ENCOUNTER — Ambulatory Visit
Admission: RE | Admit: 2020-12-09 | Discharge: 2020-12-09 | Disposition: A | Discharge status: Home / Self Care | Payer: 59 | Source: Ambulatory Visit | Attending: Adult Health | Admitting: Adult Health

## 2020-12-09 DIAGNOSIS — G35 Multiple sclerosis: Secondary | ICD-10-CM | POA: Diagnosis not present

## 2020-12-09 MED ORDER — GADOBENATE DIMEGLUMINE 529 MG/ML IV SOLN
20.0000 mL | Freq: Once | INTRAVENOUS | Status: AC | PRN
  Administered 2020-12-09: 20 mL via INTRAVENOUS

## 2021-01-20 ENCOUNTER — Other Ambulatory Visit: Payer: Self-pay | Admitting: Adult Health

## 2021-01-20 DIAGNOSIS — G35 Multiple sclerosis: Secondary | ICD-10-CM

## 2021-02-06 ENCOUNTER — Telehealth: Payer: Self-pay | Admitting: Adult Health

## 2021-02-06 DIAGNOSIS — G35 Multiple sclerosis: Secondary | ICD-10-CM

## 2021-02-06 NOTE — Telephone Encounter (Signed)
I called [facility].  Gave them the information they requested for PA on pt gilenya 0.5mg  caps (take one daily).  Urgent PA Case [id number].  Can take 6-48 hours for determination.  He has been on Gilenya since 2014 (maybe even before that).  Faxed over to them (last ofv note, labs, imaging MRI brain).

## 2021-02-06 NOTE — Telephone Encounter (Signed)
Received approval for Gilenya 02-01-2021 thru 02-01-2022 [facility]. ID [id number] REQUEST ID [id number] [phone].

## 2021-02-06 NOTE — Telephone Encounter (Signed)
[facility] has called re: the PA on pt's Fingolimod HCl 0.5 MG CAPS.  There is missing information that is needed for the PA their [phone] Fax#[phone] Case#[id number] Pt has about 7 pills.

## 2021-02-07 NOTE — Telephone Encounter (Signed)
Fax confirmation to [employer] [phone] received.

## 2021-02-22 MED ORDER — FINGOLIMOD HCL 0.5 MG PO CAPS: 1.0000 | ORAL_CAPSULE | Freq: Every day | ORAL | 5 refills | Status: DC

## 2021-02-22 NOTE — Telephone Encounter (Signed)
Pt called, Gilenya sent to the wrong pharmacy. Send prescription to:  [facility]  Ph: [phone] Fax: [phone]  Pt would like a call from the nurse.

## 2021-02-22 NOTE — Telephone Encounter (Signed)
I contacted the pt and advised we have sent rx to [facility].  Pt advised to call if any issues come up.

## 2021-02-22 NOTE — Addendum Note (Signed)
Addended by: Ann Maki on: 02/22/2021 03:18 PM   Modules accepted: Orders

## 2021-05-14 ENCOUNTER — Ambulatory Visit: Payer: 59 | Admitting: Adult Health

## 2021-05-14 ENCOUNTER — Encounter: Payer: Self-pay | Admitting: Adult Health

## 2021-05-14 VITALS — BP 128/87 | HR 88 | Ht 76.0 in | Wt 204.6 lb

## 2021-05-14 DIAGNOSIS — G35 Multiple sclerosis: Secondary | ICD-10-CM | POA: Diagnosis not present

## 2021-05-14 NOTE — Progress Notes (Signed)
PATIENT: Joshua Boyer DOB: [birth year]  REASON FOR VISIT: follow up HISTORY FROM: patient  HISTORY OF PRESENT ILLNESS: Today 05/14/21:  Joshua Boyer is a 56 year old male with a history of multiple sclerosis.  He returns today for follow-up.  At the last visit he was restarted on Gilenya restarted Gilenya due to new lesion on MRI report. Reports that he has had tingling in the feet- right foot. Very infrequent and only las <1 minute. No changes with bowels or bladder. No changes with vision. No changes with Gait or balance.  He returns today for follow-up.   11/13/20:Joshua Boyer is a 56 year old male with a history of relapsed remitting multiple sclerosis.  He returns today for follow-up.  He is on Gilenya.  Reports that he is doing well.  Denies any new symptoms.  No new numbness or weakness.  No changes with his vision.  No changes with the bowels or bladder.  Reports that his mood is stable.  His last MRI of the brain was in 2021.  He returns today for an evaluation.  05/11/20: Joshua Boyer is a 56 year old male with a history of relapsed remitting multiple sclerosis.  He returns today for follow-up.  He remains on Gilenya.  Denies any new symptoms.  Denies any new numbness or weakness.  No changes with his gait or balance.  No significant changes with his vision.  Reports on occasion he will have sharp pain that lasts less than 1 second in both eyes.  However he states that he does not do a great job with changing out his contacts.  He plans to follow-up with Dr. Dione Booze.  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  07/05/19: Joshua Boyer is a 56 year old male with a history of relapsed remitting multiple sclerosis.  He returns today for follow-up.  He states that he has been off Gilenya for approximately 1 year.  He states that he stopped the medication during the pandemic as he was laid off from his job.  Reports that he could not r  afford out-of-pocket cost.  He denies any new symptoms.  No change with his gait or balance.  No new numbness or weakness.  No changes with his vision.  Overall he feels that he has been doing well.  He returns today for follow-up.  HISTORY 02/27/18  Joshua Boyer is a 56 year old African-American male with history of MS who presents for follow-up today.  The patient remains on Gilenya and is doing well with this medication.  He reports he had an exacerbation back in September 2019 with left arm and leg numbness, balance change, fatigue, and change in the vision in his left eye.  He was treated with a prednisone Dosepak and his symptoms resolved shortly thereafter.  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  He denies any problems or concerns today.  He denies any new symptoms of numbness or weakness.  He denies any problems with his bowels or bladder.  He denies any falls.  He denies any changes in his vision.  He reports that he is currently in between jobs and looking for a new job.  He had a normal eye exam in September 2019 with Dr. Nile Riggs.  He reports that he is sleeping better now that he is taking CBD with melatonin.  He presents today for  follow-up unaccompanied.    REVIEW OF SYSTEMS: Out of a complete 14 system review of symptoms, the patient complains only of the following symptoms, and all other reviewed systems are negative.  See HPI  ALLERGIES: No Known Allergies  HOME MEDICATIONS: Outpatient Medications Prior to Visit  Medication Sig Dispense Refill   Cholecalciferol (VITAMIN D3) 1000 UNITS CAPS Take 1 capsule (1,000 Units total) by mouth daily.     Fingolimod HCl 0.5 MG CAPS Take 1 capsule (0.5 mg total) by mouth daily. 30 capsule 5   No facility-administered medications prior to visit.    PAST MEDICAL HISTORY: Past Medical History:  Diagnosis Date   Chronic insomnia 10/07/2017   Migraine headache    MS (multiple  sclerosis) (HCC)    Multiple sclerosis (HCC) 11/27/2012   Optic neuritis, left     PAST SURGICAL HISTORY: Past Surgical History:  Procedure Laterality Date   HAND TENDON SURGERY     HERNIA REPAIR     KNEE SURGERY     ROTATOR CUFF REPAIR      FAMILY HISTORY: Family History  Problem Relation Age of Onset   Glaucoma Mother    Diabetes Mother    Stroke Father    Heart attack Father    Cancer - Prostate Father    Multiple sclerosis Neg Hx     SOCIAL HISTORY: Social History   Socioeconomic History   Marital status: Divorced    Spouse name: Not on file   Number of children: 1   Years of education: BS    Highest education level: Not on file  Occupational History    Employer: [employer]  Tobacco Use   Smoking status: Every Day    Packs/day: 0.50    Years: 30.00    Pack years: 15.00    Types: Cigarettes   Smokeless tobacco: Never  Vaping Use   Vaping Use: Never used  Substance and Sexual Activity   Alcohol use: Yes    Alcohol/week: 0.0 standard drinks    Comment: social   Drug use: No   Sexual activity: Never  Other Topics Concern   Not on file  Social History Narrative   Patient lives at home. His father lives with him and they help each other out.    Patient has 1 child.    Patient is currently working.    Patient is right handed.    Patient has his BS degree.   Patient drinks at least 1-2 cups of caffeine daily.      Social Determinants of Health   Financial Resource Strain: Not on file  Food Insecurity: Not on file  Transportation Needs: Not on file  Physical Activity: Not on file  Stress: Not on file  Social Connections: Not on file  Intimate Partner Violence: Not on file      PHYSICAL EXAM  Vitals:   05/14/21 1337  BP: 128/87  Pulse: 88  Weight: 204 lb 9.6 oz (92.8 kg)  Height: 6\' 4"  (1.93 m)    Body mass index is 24.9 kg/m.  Generalized: Well developed, in no acute distress   Neurological examination  Mentation: Alert oriented to time,  place, history taking. Follows all commands speech and language fluent Cranial nerve II-XII: Pupils were equal round reactive to light. Extraocular movements were full, visual field were full on confrontational test. . Head turning and shoulder shrug  were normal and symmetric. Motor: The motor testing reveals 5 over 5 strength of all 4 extremities. Good symmetric motor tone  is noted throughout.  Sensory: Sensory testing is intact to soft touch on all 4 extremities. No evidence of extinction is noted.  Coordination: Cerebellar testing reveals good finger-nose-finger and heel-to-shin bilaterally.  Gait and station: Gait is normal.   Reflexes: Deep tendon reflexes are symmetric and normal bilaterally.   DIAGNOSTIC DATA (LABS, IMAGING, TESTING) - I reviewed patient records, labs, notes, testing and imaging myself where available.  Lab Results  Component Value Date   WBC 9.8 11/13/2020   HGB 14.3 11/13/2020   HCT 43.2 11/13/2020   MCV 91 11/13/2020   PLT 310 11/13/2020      Component Value Date/Time   NA 145 (H) 11/13/2020 1419   K 4.7 11/13/2020 1419   CL 106 11/13/2020 1419   CO2 25 11/13/2020 1419   GLUCOSE 84 11/13/2020 1419   GLUCOSE 99 06/04/2019 1451   BUN 18 11/13/2020 1419   CREATININE 1.09 11/13/2020 1419   CREATININE 1.15 06/04/2019 1451   CALCIUM 9.9 11/13/2020 1419   PROT 6.9 11/13/2020 1419   ALBUMIN 4.8 11/13/2020 1419   AST 14 11/13/2020 1419   ALT 18 11/13/2020 1419   ALKPHOS 64 11/13/2020 1419   BILITOT 0.5 11/13/2020 1419   GFRNONAA 65 02/27/2018 1009   GFRAA 75 02/27/2018 1009   Lab Results  Component Value Date   CHOL 219 (H) 06/04/2019   HDL 38 (L) 06/04/2019   LDLCALC 158 (H) 06/04/2019   TRIG 114 06/04/2019   CHOLHDL 5.8 (H) 06/04/2019    Lab Results  Component Value Date   TSH 0.97 06/04/2019      ASSESSMENT AND PLAN 56 y.o. year old male  has a past medical history of Chronic insomnia (10/07/2017), Migraine headache, MS (multiple  sclerosis) (HCC), Multiple sclerosis (HCC) (11/27/2012), and Optic neuritis, left. here with:  Relapsed remitting multiple sclerosis   Continue Gilenya Blood work today  Advised if symptoms worsen or he develops new symptoms he should let us know Follow-up in 6 months or sooner if needed   Patient was previously established with Dr. Anne Hahn as his primary neurologist.  Dr. Anne Hahn has since retired.  New primary neurologist will be Dr. Marjory Lies.  Patient has remained relatively stable.  He will continue following up with me for now.   Butch Penny, MSN, NP-C 05/14/2021, 1:38 PM [facility] [address] [place], [place] [address] [phone]

## 2021-05-14 NOTE — Patient Instructions (Addendum)
Your Plan:  Continue Gilenya  Blood work today  If your symptoms worsen or you develop new symptoms please let us know.   Thank you for coming to see us at [facility]. I hope we have been able to provide you high quality care today.  You may receive a patient satisfaction survey over the next few weeks. We would appreciate your feedback and comments so that we may continue to improve ourselves and the health of our patients.  

## 2021-05-15 LAB — CBC WITH DIFFERENTIAL/PLATELET
Basophils Absolute: 0 10*3/uL (ref 0.0–0.2)
Basos: 1 %
EOS (ABSOLUTE): 0.2 10*3/uL (ref 0.0–0.4)
Eos: 2 %
Hematocrit: 43.8 % (ref 37.5–51.0)
Hemoglobin: 14.7 g/dL (ref 13.0–17.7)
Immature Grans (Abs): 0 10*3/uL (ref 0.0–0.1)
Immature Granulocytes: 0 %
Lymphocytes Absolute: 1.1 10*3/uL (ref 0.7–3.1)
Lymphs: 13 %
MCH: 30 pg (ref 26.6–33.0)
MCHC: 33.6 g/dL (ref 31.5–35.7)
MCV: 89 fL (ref 79–97)
Monocytes Absolute: 0.8 10*3/uL (ref 0.1–0.9)
Monocytes: 9 %
Neutrophils Absolute: 6.7 10*3/uL (ref 1.4–7.0)
Neutrophils: 75 %
Platelets: 329 10*3/uL (ref 150–450)
RBC: 4.9 x10E6/uL (ref 4.14–5.80)
RDW: 14.1 % (ref 11.6–15.4)
WBC: 8.9 10*3/uL (ref 3.4–10.8)

## 2021-05-15 LAB — COMPREHENSIVE METABOLIC PANEL
ALT: 9 IU/L (ref 0–44)
AST: 10 IU/L (ref 0–40)
Albumin/Globulin Ratio: 1.8 (ref 1.2–2.2)
Albumin: 4.7 g/dL (ref 3.8–4.9)
Alkaline Phosphatase: 70 IU/L (ref 44–121)
BUN/Creatinine Ratio: 11 (ref 9–20)
BUN: 14 mg/dL (ref 6–24)
Bilirubin Total: 0.3 mg/dL (ref 0.0–1.2)
CO2: 25 mmol/L (ref 20–29)
Calcium: 10.3 mg/dL — ABNORMAL HIGH (ref 8.7–10.2)
Chloride: 104 mmol/L (ref 96–106)
Creatinine, Ser: 1.24 mg/dL (ref 0.76–1.27)
Globulin, Total: 2.6 g/dL (ref 1.5–4.5)
Glucose: 99 mg/dL (ref 70–99)
Potassium: 4.9 mmol/L (ref 3.5–5.2)
Sodium: 142 mmol/L (ref 134–144)
Total Protein: 7.3 g/dL (ref 6.0–8.5)
eGFR: 69 mL/min/{1.73_m2} (ref >59)

## 2021-08-15 ENCOUNTER — Other Ambulatory Visit: Payer: Self-pay | Admitting: Adult Health

## 2021-08-15 DIAGNOSIS — G35 Multiple sclerosis: Secondary | ICD-10-CM

## 2021-10-02 ENCOUNTER — Encounter: Payer: Self-pay | Admitting: Adult Health

## 2021-10-02 DIAGNOSIS — G35 Multiple sclerosis: Secondary | ICD-10-CM

## 2021-10-03 MED ORDER — FINGOLIMOD HCL 0.5 MG PO CAPS: 1.0000 | ORAL_CAPSULE | Freq: Every day | ORAL | 2 refills | Status: DC

## 2021-10-03 NOTE — Telephone Encounter (Signed)
[facility] - PA Case ID: [id number]  For Fingolimod 0.5mg  po qday.  Outcome Approvedtoday CaseId:[id number];Status:Approved;Review Type:Prior Auth;Coverage Start Date:10/03/2021;Coverage End Date:10/03/2022;

## 2021-10-10 ENCOUNTER — Other Ambulatory Visit: Payer: Self-pay | Admitting: *Deleted

## 2021-10-10 ENCOUNTER — Other Ambulatory Visit: Payer: Self-pay | Admitting: Adult Health

## 2021-10-10 DIAGNOSIS — G35 Multiple sclerosis: Secondary | ICD-10-CM

## 2021-10-10 MED ORDER — GILENYA 0.5 MG PO CAPS: 0.5000 mg | ORAL_CAPSULE | Freq: Every day | ORAL | 3 refills | Status: DC

## 2021-10-10 NOTE — Telephone Encounter (Signed)
Spoke to patient  made him aware placed new order for [facility] and sent to [facility] per patient request

## 2021-10-16 ENCOUNTER — Telehealth: Payer: Self-pay | Admitting: *Deleted

## 2021-10-16 NOTE — Telephone Encounter (Signed)
Completed Gilenya PA on Cover My Meds. Key: [id number]. Awaiting determination from [facility].

## 2021-10-17 ENCOUNTER — Encounter: Payer: Self-pay | Admitting: *Deleted

## 2021-10-17 NOTE — Telephone Encounter (Signed)
Gilenya denied. Insurance requires generic unless:  "Multi-Source Brand (MSB) Name drugs are considered medically necessary when there is  documentation of both of the following: 1. The individual has tried a bioequivalent generic form of  the drug; and 2. The individual cannot take the bioequivalent generic form of the drug due to an  allergy or bad reaction to one or more inactive ingredients (dyes, fillers, preservatives) which are  not present in the brand form of the drug. There is nothing to support that this information was  received."  If you feel this deserves further consideration, you, the patient or an authorized representative of the  patient may appeal this decision within one year of the date of this letter. Be sure to include additional  information (not previously submitted) which will support your request. This will assist [place] in our review.  Please send your request to: [place] [address] [place]: [phone] Fax: [phone]

## 2021-10-23 NOTE — Telephone Encounter (Signed)
Stacy w/ [employer] called and relayed the message that pt's Gilenya appeal went to a committee and the physicians reviewed his case and decided to uphold the denial. She states there will be a letter coming in the mail with more detail, but essentially the reason why it was denied is because the patient has to have a significant allergy or serious adverse effect with the Fingolimod and the GI symptoms he had were not determined to be serious allergies or adverse effects. She stated she called the patient last Friday and told him the news. She said it was an emotional phone call. I will discuss the next steps.with [facility] NP.

## 2021-10-24 NOTE — Telephone Encounter (Signed)
I tried to call the patient but his VM was full. Will send mychart message. There is no longer an availability with [facility] tomorrow but there is a 3:00 appt today. If patient cannot come today, we can schedule him for a video visit this Friday morning 10/26/21. She does not want him going without treatment so we need to see him soon and not wait until the appt that is pending for 11/19/21.

## 2021-10-24 NOTE — Telephone Encounter (Signed)
Can you see if he can come in tomorrow. There is a slot held in the afternoon

## 2021-10-26 ENCOUNTER — Telehealth (INDEPENDENT_AMBULATORY_CARE_PROVIDER_SITE_OTHER): Payer: Commercial Managed Care - HMO | Admitting: Adult Health

## 2021-10-26 DIAGNOSIS — G35 Multiple sclerosis: Secondary | ICD-10-CM | POA: Diagnosis not present

## 2021-10-26 NOTE — Progress Notes (Signed)
PATIENT: Joshua Boyer DOB: [birth year]  REASON FOR VISIT: follow up HISTORY FROM: patient  Virtual Visit via Video Note  I connected with Joshua Boyer on 10/26/21 at  9:00 AM EDT by a video enabled telemedicine application located remotely at [facility] and verified that I am speaking with the correct person using two identifiers who was located at their own home.   I discussed the limitations of evaluation and management by telemedicine and the availability of in person appointments. The patient expressed understanding and agreed to proceed.   PATIENT: Joshua Boyer DOB: [birth year]  REASON FOR VISIT: follow up HISTORY FROM: patient  HISTORY OF PRESENT ILLNESS: Today 10/26/21:  Joshua Boyer is a 56 year old male with a history of multiple sclerosis.  He returns today for follow-up.  The patient's insurance no longer covers Gilenya brand-name.  He has tried generic version but has GI upset.  This visit was scheduled to discuss other treatment options.  In regards to his multiple sclerosis he states that everything is stable.  No new symptoms.  His last MRI of the brain was in November 2022-it did show 1 new focus in the left frontal lobe.  Today we discussed Zeposia and Mayzent.    HISTORY 05/14/21:   Joshua Boyer is a 56 year old male with a history of multiple sclerosis.  He returns today for follow-up.  At the last visit he was restarted on Gilenya restarted Gilenya due to new lesion on MRI report. Reports that he has had tingling in the feet- right foot. Very infrequent and only las <1 minute. No changes with bowels or bladder. No changes with vision. No changes with Gait or balance.  He returns today for follow-up.  REVIEW OF SYSTEMS: Out of a complete 14 system review of symptoms, the patient complains only of the following symptoms, and all other reviewed systems are negative.  ALLERGIES: No Known Allergies  HOME MEDICATIONS: Outpatient Medications  Prior to Visit  Medication Sig Dispense Refill   Cholecalciferol (VITAMIN D3) 1000 UNITS CAPS Take 1 capsule (1,000 Units total) by mouth daily.     GILENYA 0.5 MG CAPS Take 1 capsule (0.5 mg total) by mouth daily. 30 capsule 3   No facility-administered medications prior to visit.    PAST MEDICAL HISTORY: Past Medical History:  Diagnosis Date   Chronic insomnia 10/07/2017   Migraine headache    MS (multiple sclerosis) (HCC)    Multiple sclerosis (HCC) 11/27/2012   Optic neuritis, left     PAST SURGICAL HISTORY: Past Surgical History:  Procedure Laterality Date   HAND TENDON SURGERY     HERNIA REPAIR     KNEE SURGERY     ROTATOR CUFF REPAIR      FAMILY HISTORY: Family History  Problem Relation Age of Onset   Glaucoma Mother    Diabetes Mother    Stroke Father    Heart attack Father    Cancer - Prostate Father    Multiple sclerosis Neg Hx     SOCIAL HISTORY: Social History   Socioeconomic History   Marital status: Divorced    Spouse name: Not on file   Number of children: 1   Years of education: BS    Highest education level: Not on file  Occupational History    Employer: [employer]  Tobacco Use   Smoking status: Every Day    Packs/day: 0.50    Years: 30.00    Total pack years: 15.00  Types: Cigarettes   Smokeless tobacco: Never  Vaping Use   Vaping Use: Never used  Substance and Sexual Activity   Alcohol use: Yes    Alcohol/week: 0.0 standard drinks of alcohol    Comment: social   Drug use: No   Sexual activity: Never  Other Topics Concern   Not on file  Social History Narrative   Patient lives at home. His father lives with him and they help each other out.    Patient has 1 child.    Patient is currently working.    Patient is right handed.    Patient has his BS degree.   Patient drinks at least 1-2 cups of caffeine daily.      Social Determinants of Health   Financial Resource Strain: Not on file  Food Insecurity: Not on file  Transportation  Needs: Not on file  Physical Activity: Not on file  Stress: Not on file  Social Connections: Not on file  Intimate Partner Violence: Not on file      PHYSICAL EXAM Generalized: Well developed, in no acute distress   Neurological examination  Mentation: Alert oriented to time, place, history taking. Follows all commands speech and language fluent Cranial nerve II-XII:Extraocular movements were full. Facial symmetry noted. Head turning and shoulder shrug  were normal and symmetric. Gait and station: Patient is able to stand from a seated position. gait is normal.    DIAGNOSTIC DATA (LABS, IMAGING, TESTING) - I reviewed patient records, labs, notes, testing and imaging myself where available.  Lab Results  Component Value Date   WBC 8.9 05/14/2021   HGB 14.7 05/14/2021   HCT 43.8 05/14/2021   MCV 89 05/14/2021   PLT 329 05/14/2021      Component Value Date/Time   NA 142 05/14/2021 1417   K 4.9 05/14/2021 1417   CL 104 05/14/2021 1417   CO2 25 05/14/2021 1417   GLUCOSE 99 05/14/2021 1417   GLUCOSE 99 06/04/2019 1451   BUN 14 05/14/2021 1417   CREATININE 1.24 05/14/2021 1417   CREATININE 1.15 06/04/2019 1451   CALCIUM 10.3 (H) 05/14/2021 1417   PROT 7.3 05/14/2021 1417   ALBUMIN 4.7 05/14/2021 1417   AST 10 05/14/2021 1417   ALT 9 05/14/2021 1417   ALKPHOS 70 05/14/2021 1417   BILITOT 0.3 05/14/2021 1417   GFRNONAA 65 02/27/2018 1009   GFRAA 75 02/27/2018 1009   Lab Results  Component Value Date   CHOL 219 (H) 06/04/2019   HDL 38 (L) 06/04/2019   LDLCALC 158 (H) 06/04/2019   TRIG 114 06/04/2019   CHOLHDL 5.8 (H) 06/04/2019   Lab Results  Component Value Date   TSH 0.97 06/04/2019      ASSESSMENT AND PLAN 56 y.o. year old male  has a past medical history of Chronic insomnia (10/07/2017), Migraine headache, MS (multiple sclerosis) (HCC), Multiple sclerosis (HCC) (11/27/2012), and Optic neuritis, left. here with:  1.  Multiple sclerosis  Today we discussed  starting Mayzent or Zeposia.  I will provide the patient with information about both medications.  He will read over this through the weekend and let me know his thoughts on Monday.  Also advised that I would discuss his plan of care with Dr. Epimenio Foot on Monday.  In regards to his MS he has been relatively stable.  May consider repeating imaging in the next couple months.  We will discuss follow-up once he is started on new medication    Butch Penny, MSN, NP-C 10/26/2021,  9:09 AM [facility] [address] [place], [place] [address] [phone]

## 2021-10-28 ENCOUNTER — Encounter: Payer: Self-pay | Admitting: Adult Health

## 2021-11-12 ENCOUNTER — Telehealth: Payer: Self-pay

## 2021-11-12 NOTE — Telephone Encounter (Signed)
Received mayzent start form from patient. Jinny Blossom, NP signed start  form. Requesting assistance with CBC, LFTs, CYP2CP genotyping, EKG, and ME screening prior to starting mayzent.  Faxed to [employer]. Received a receipt of confirmation.

## 2021-11-13 NOTE — Telephone Encounter (Signed)
Received mayzent 2mg  PA request on CMM. Since patient has not had CYP2C9 genotyping to confirm dosage, I will wait to complete the PA.

## 2021-11-19 ENCOUNTER — Other Ambulatory Visit: Payer: Self-pay

## 2021-11-19 ENCOUNTER — Ambulatory Visit: Payer: 59 | Admitting: Adult Health

## 2021-11-19 NOTE — Telephone Encounter (Signed)
Per CMM, appointment is pending for his labs, EKG, and ME screening prior to [facility] therapy.

## 2021-11-29 NOTE — Telephone Encounter (Signed)
Received test results from [employer] for [employer].  To MM/NP for review in her inbox.

## 2021-12-03 NOTE — Telephone Encounter (Signed)
EKG on 11/28/21- NSR with no AV conduction abnormality.  ME screening on 11/28/21-No apparent macular edema in either eye.  Waiting for lab results to confirm mayzent dosage.

## 2021-12-11 NOTE — Telephone Encounter (Signed)
PA for [employer] starter kit approved: " CaseId:[id number];Status:Approved;Review Type:Prior Auth;Coverage Start Date:12/11/2021;Coverage End Date:12/11/2022;"

## 2021-12-11 NOTE — Telephone Encounter (Signed)
CYP2C9 genotyping showed *1/*1. Therefore, standard 87m dose is appropriate. Completed PA for mayzent starter kit via CMM. Key: [id number]. Sent to [employer] Should have a determination within 3-5 days.

## 2021-12-11 NOTE — Telephone Encounter (Signed)
Completed PA for mayzent 2mg  via CMM. Sent to . Should have a determination within 3-5 business days. [employer].

## 2022-01-16 ENCOUNTER — Encounter: Payer: Self-pay | Admitting: Adult Health

## 2022-01-17 NOTE — Telephone Encounter (Signed)
Mayzent clear for therapy form signed per MM/NP from the patient support program. Fax confirmation received 01-16-2022 [phone].

## 2022-01-30 NOTE — Telephone Encounter (Signed)
I called [phone] [facility] about pt mayzent 2mg  tabs.  Not able to move forward with PA on CMM.  She will fax to me the form to complete.

## 2022-01-30 NOTE — Telephone Encounter (Signed)
I called and spoke to [place] at [place].  She faxed to me form to complete for Mayzent 2mg  tablets. Completed to sign and then will refax.

## 2022-02-06 MED ORDER — MAYZENT 2 MG PO TABS: 2.0000 mg | ORAL_TABLET | Freq: Every day | ORAL | 11 refills | Status: DC

## 2022-02-06 NOTE — Addendum Note (Signed)
Addended by: Brandon Melnick on: 02/06/2022 09:53 AM   Modules accepted: Orders

## 2022-02-06 NOTE — Telephone Encounter (Signed)
Oscar HP of NS [phone] LS, 02-03-2022 thru 02-04-2023.

## 2022-08-01 ENCOUNTER — Telehealth: Payer: Self-pay | Admitting: Adult Health

## 2022-08-01 ENCOUNTER — Encounter: Payer: Self-pay | Admitting: Adult Health

## 2022-08-01 ENCOUNTER — Ambulatory Visit (INDEPENDENT_AMBULATORY_CARE_PROVIDER_SITE_OTHER): Payer: 59 | Admitting: Adult Health

## 2022-08-01 VITALS — BP 123/80 | HR 86 | Ht 76.0 in | Wt 188.0 lb

## 2022-08-01 DIAGNOSIS — G35 Multiple sclerosis: Secondary | ICD-10-CM | POA: Diagnosis not present

## 2022-08-01 NOTE — Progress Notes (Signed)
PATIENT: Joshua Boyer DOB: 03/19/1965  REASON FOR VISIT: follow up HISTORY FROM: patient  Chief Complaint  Patient presents with   Follow-up    Rm 19 alone Pt is well, reports he has bilateral tingling in toes occasionally.  No other concerns     HISTORY OF PRESENT ILLNESS: Today 08/01/22:  Joshua Boyer is a 57 y.o. male with a history of multiple sclerosis. Returns today for follow-up.  Overall he has been doing well.  He is now on Mayzent.  Tolerating the medication well.  Denies any new symptoms.  No change in his gait or balance.  No new numbness or weakness.  Reports occasionally he gets some tingling in the toes.  No changes with the bowels or bladder.  No changes with his vision.  His last MRI of the brain was in November 2022.  He returns today for an evaluation.   10/26/21:    Joshua Boyer is a 57 year old male with a history of multiple sclerosis.  He returns today for follow-up.  The patient's insurance no longer covers Gilenya brand-name.  He has tried generic version but has GI upset.  This visit was scheduled to discuss other treatment options.  In regards to his multiple sclerosis he states that everything is stable.  No new symptoms.  His last MRI of the brain was in November 2022-it did show 1 new focus in the left frontal lobe.  Today we discussed Zeposia and Mayzent.      05/14/21: Joshua Boyer is a 57 year old male with a history of multiple sclerosis.  He returns today for follow-up.  At the last visit he was restarted on Gilenya restarted Gilenya due to new lesion on MRI report. Reports that he has had tingling in the feet- right foot. Very infrequent and only las <1 minute. No changes with bowels or bladder. No changes with vision. No changes with Gait or balance.  He returns today for follow-up.   11/13/20:Joshua Boyer is a 57 year old male with a history of relapsed remitting multiple sclerosis.  He returns today for follow-up.  He is on Gilenya.  Reports  that he is doing well.  Denies any new symptoms.  No new numbness or weakness.  No changes with his vision.  No changes with the bowels or bladder.  Reports that his mood is stable.  His last MRI of the brain was in 2021.  He returns today for an evaluation.  05/11/20: Joshua Boyer is a 57 year old male with a history of relapsed remitting multiple sclerosis.  He returns today for follow-up.  He remains on Gilenya.  Denies any new symptoms.  Denies any new numbness or weakness.  No changes with his gait or balance.  No significant changes with his vision.  Reports on occasion he will have sharp pain that lasts less than 1 second in both eyes.  However he states that he does not do a great job with changing out his contacts.  He plans to follow-up with Dr. Dione Booze.  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  07/05/19: Joshua Boyer is a 57 year old male with a history of relapsed remitting multiple sclerosis.  He returns today for follow-up.  He states that he has been off Gilenya for approximately 1 year.  He states that he stopped the medication during the pandemic as he was laid off from his job.  Reports that he could not  r afford out-of-pocket cost.  He denies any new symptoms.  No change with his gait or balance.  No new numbness or weakness.  No changes with his vision.  Overall he feels that he has been doing well.  He returns today for follow-up.  HISTORY 02/27/18  Joshua Boyer is a 57 year old African-American male with history of MS who presents for follow-up today.  The patient remains on Gilenya and is doing well with this medication.  He reports he had an exacerbation back in September 2019 with left arm and leg numbness, balance change, fatigue, and change in the vision in his left eye.  He was treated with a prednisone Dosepak and his symptoms resolved shortly thereafter.  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  He denies any problems or concerns today.  He denies any new symptoms of numbness or weakness.  He denies any problems with his bowels or bladder.  He denies any falls.  He denies any changes in his vision.  He reports that he is currently in between jobs and looking for a new job.  He had a normal eye exam in September 2019 with Dr. Nile Riggs.  He reports that he is sleeping better now that he is taking CBD with melatonin.  He presents today for follow-up unaccompanied.    REVIEW OF SYSTEMS: Out of a complete 14 system review of symptoms, the patient complains only of the following symptoms, and all other reviewed systems are negative.  See HPI  ALLERGIES: No Known Allergies  HOME MEDICATIONS: Outpatient Medications Prior to Visit  Medication Sig Dispense Refill   Cholecalciferol (VITAMIN D3) 1000 UNITS CAPS Take 1 capsule (1,000 Units total) by mouth daily.     Siponimod Fumarate (MAYZENT) 2 MG TABS Take 1 tablet (2 mg total) by mouth daily. 30 tablet 11   No facility-administered medications prior to visit.    PAST MEDICAL HISTORY: Past Medical History:  Diagnosis Date   Chronic insomnia 10/07/2017   Migraine headache    MS (multiple sclerosis) (HCC)    Multiple sclerosis (HCC) 11/27/2012   Optic neuritis, left     PAST SURGICAL HISTORY: Past Surgical History:  Procedure Laterality Date   HAND TENDON SURGERY     HERNIA REPAIR     KNEE SURGERY     ROTATOR CUFF REPAIR      FAMILY HISTORY: Family History  Problem Relation Age of Onset   Glaucoma Mother    Diabetes Mother    Stroke Father    Heart attack Father    Cancer - Prostate Father    Multiple sclerosis Neg Hx     SOCIAL HISTORY: Social History   Socioeconomic History   Marital status: Divorced    Spouse name: Not on file   Number of children: 1   Years of education: BS    Highest education level: Not on file  Occupational History    Employer: [employer]  Tobacco Use   Smoking  status: Every Day    Current packs/day: 0.50    Average packs/day: 0.5 packs/day for 30.0 years (15.0 ttl pk-yrs)    Types: Cigarettes   Smokeless tobacco: Never  Vaping Use   Vaping status: Never Used  Substance and Sexual Activity   Alcohol use: Yes    Alcohol/week: 0.0 standard drinks of alcohol    Comment: social   Drug use: No   Sexual activity: Never  Other Topics Concern   Not on file  Social History Narrative   Patient lives at home. His father lives with him and they help each other out.    Patient has 1 child.    Patient is currently working.    Patient is right handed.    Patient has his BS degree.   Patient drinks at least 1-2 cups of caffeine daily.      Social Determinants of Health   Financial Resource Strain: Not on file  Food Insecurity: Not on file  Transportation Needs: Not on file  Physical Activity: Not on file  Stress: Not on file  Social Connections: Not on file  Intimate Partner Violence: Not on file      PHYSICAL EXAM  Vitals:   08/01/22 0835  BP: 123/80  Pulse: 86  Weight: 188 lb (85.3 kg)  Height: 6\' 4"  (1.93 m)    Body mass index is 22.88 kg/m.  Generalized: Well developed, in no acute distress   Neurological examination  Mentation: Alert oriented to time, place, history taking. Follows all commands speech and language fluent Cranial nerve II-XII: Pupils were equal round reactive to light. Extraocular movements were full, visual field were full on confrontational test. . Head turning and shoulder shrug  were normal and symmetric. Motor: The motor testing reveals 5 over 5 strength of all 4 extremities. Good symmetric motor tone is noted throughout.  Sensory: Sensory testing is intact to soft touch on all 4 extremities. No evidence of extinction is noted.  Coordination: Cerebellar testing reveals good finger-nose-finger and heel-to-shin bilaterally.  Gait and station: Gait is normal.   Reflexes: Deep tendon reflexes are symmetric  but the depressed throughout  DIAGNOSTIC DATA (LABS, IMAGING, TESTING) - I reviewed patient records, labs, notes, testing and imaging myself where available.  Lab Results  Component Value Date   WBC 8.9 05/14/2021   HGB 14.7 05/14/2021   HCT 43.8 05/14/2021   MCV 89 05/14/2021   PLT 329 05/14/2021      Component Value Date/Time   NA 142 05/14/2021 1417   K 4.9 05/14/2021 1417   CL 104 05/14/2021 1417   CO2 25 05/14/2021 1417   GLUCOSE 99 05/14/2021 1417   GLUCOSE 99 06/04/2019 1451   BUN 14 05/14/2021 1417   CREATININE 1.24 05/14/2021 1417   CREATININE 1.15 06/04/2019 1451   CALCIUM 10.3 (H) 05/14/2021 1417   PROT 7.3 05/14/2021 1417   ALBUMIN 4.7 05/14/2021 1417   AST 10 05/14/2021 1417   ALT 9 05/14/2021 1417   ALKPHOS 70 05/14/2021 1417   BILITOT 0.3 05/14/2021 1417   GFRNONAA 65 02/27/2018 1009   GFRAA 75 02/27/2018 1009   Lab Results  Component Value Date   CHOL 219 (H) 06/04/2019   HDL 38 (L) 06/04/2019   LDLCALC 158 (H) 06/04/2019   TRIG 114 06/04/2019   CHOLHDL 5.8 (H) 06/04/2019    Lab Results  Component Value Date   TSH 0.97 06/04/2019      ASSESSMENT AND PLAN 57 y.o. year old male  has a past medical history of Chronic insomnia (10/07/2017), Migraine headache, MS (multiple sclerosis) (HCC), Multiple sclerosis (HCC) (11/27/2012), and Optic neuritis, left. here with:  Relapsed remitting multiple sclerosis   Continue  Mayzent Blood work today- CBC.CMP MRI of the brain with and without contrast to look for progression of MS Advised if symptoms worsen or he develops new symptoms he should let us know Follow-up in 6 months or sooner if needed  Butch Penny, MSN, NP-C 08/01/2022, 8:42 AM [facility] [address] [place], [place] [address] [phone]

## 2022-08-01 NOTE — Telephone Encounter (Signed)
[facility] [place]: [id number] exp. 08/01/22-09/29/22 sent to GI [phone]

## 2022-08-01 NOTE — Patient Instructions (Signed)
Your Plan:  Continue mayzent Blood work today MRI brain ordered     Thank you for coming to see [place] at [facility]. I hope we have been able to provide you high quality care today.  You may receive a patient satisfaction survey over the next few weeks. We would appreciate your feedback and comments so that we may continue to improve ourselves and the health of our patients.

## 2022-08-02 LAB — CBC WITH DIFFERENTIAL/PLATELET
Basophils Absolute: 0 10*3/uL (ref 0.0–0.2)
Basos: 0 %
EOS (ABSOLUTE): 0.1 10*3/uL (ref 0.0–0.4)
Eos: 2 %
Hematocrit: 41.8 % (ref 37.5–51.0)
Hemoglobin: 14 g/dL (ref 13.0–17.7)
Immature Grans (Abs): 0 10*3/uL (ref 0.0–0.1)
Immature Granulocytes: 0 %
Lymphocytes Absolute: 0.8 10*3/uL (ref 0.7–3.1)
Lymphs: 9 %
MCH: 30.1 pg (ref 26.6–33.0)
MCHC: 33.5 g/dL (ref 31.5–35.7)
MCV: 90 fL (ref 79–97)
Monocytes Absolute: 0.9 10*3/uL (ref 0.1–0.9)
Monocytes: 10 %
Neutrophils Absolute: 7.5 10*3/uL — ABNORMAL HIGH (ref 1.4–7.0)
Neutrophils: 79 %
Platelets: 290 10*3/uL (ref 150–450)
RBC: 4.65 x10E6/uL (ref 4.14–5.80)
RDW: 14.5 % (ref 11.6–15.4)
WBC: 9.4 10*3/uL (ref 3.4–10.8)

## 2022-08-02 LAB — COMPREHENSIVE METABOLIC PANEL
ALT: 16 IU/L (ref 0–44)
AST: 15 IU/L (ref 0–40)
Albumin: 4.4 g/dL (ref 3.8–4.9)
Alkaline Phosphatase: 65 IU/L (ref 44–121)
BUN/Creatinine Ratio: 17 (ref 9–20)
BUN: 23 mg/dL (ref 6–24)
Bilirubin Total: 0.3 mg/dL (ref 0.0–1.2)
CO2: 23 mmol/L (ref 20–29)
Calcium: 9.7 mg/dL (ref 8.7–10.2)
Chloride: 103 mmol/L (ref 96–106)
Creatinine, Ser: 1.32 mg/dL — ABNORMAL HIGH (ref 0.76–1.27)
Globulin, Total: 2.3 g/dL (ref 1.5–4.5)
Glucose: 130 mg/dL — ABNORMAL HIGH (ref 70–99)
Potassium: 4.2 mmol/L (ref 3.5–5.2)
Sodium: 142 mmol/L (ref 134–144)
Total Protein: 6.7 g/dL (ref 6.0–8.5)
eGFR: 64 mL/min/{1.73_m2} (ref >59)

## 2022-09-02 ENCOUNTER — Other Ambulatory Visit: Payer: 59

## 2023-01-13 ENCOUNTER — Other Ambulatory Visit: Payer: Self-pay | Admitting: Adult Health

## 2023-02-03 ENCOUNTER — Telehealth: Payer: Self-pay | Admitting: Adult Health

## 2023-02-03 NOTE — Telephone Encounter (Signed)
[facility]/[place] Checking on PA for MAYZENT 2 MG TABS.

## 2023-02-03 NOTE — Telephone Encounter (Signed)
This has been faxed to the PA team.

## 2023-02-04 ENCOUNTER — Telehealth: Payer: Self-pay

## 2023-02-04 ENCOUNTER — Other Ambulatory Visit (HOSPITAL_COMMUNITY): Payer: Self-pay

## 2023-02-04 NOTE — Telephone Encounter (Signed)
PA request has been Submitted. New Encounter created for follow up. For additional info see Pharmacy Prior Auth telephone encounter from 02/04/2023.

## 2023-02-04 NOTE — Telephone Encounter (Signed)
Pharmacy Patient Advocate Encounter   Received notification from Physician's Office that prior authorization for Mayzent 2MG  tablets is required/requested.   Insurance verification completed.   The patient is insured through [facility] .   Per test claim: PA required; PA submitted to above mentioned insurance via CoverMyMeds Key/confirmation #/EOC [id number] Status is pending

## 2023-02-05 NOTE — Telephone Encounter (Signed)
Pharmacy Patient Advocate Encounter   Received notification from Physician's Office that prior authorization for Mayzent 2MG  tablets is required/requested.   Insurance verification completed.   The patient is insured through  [employer]  .   Per test claim: PA required; PA submitted to above mentioned insurance via CoverMyMeds Key/confirmation #/EOC [employer] Status is pending

## 2023-02-05 NOTE — Telephone Encounter (Signed)
Pharmacy Patient Advocate Encounter  Received notification from  [facility] [employer]  that Prior Authorization for Mayzent has been DENIED.  Full denial letter will be uploaded to the media tab. See denial reason below.     PA #/Case ID/Reference #: PA Case ID #: [id number]  To note they did not ask about any of this on the [facility] PA clinical questions.

## 2023-02-06 ENCOUNTER — Other Ambulatory Visit (HOSPITAL_COMMUNITY): Payer: Self-pay

## 2023-02-06 NOTE — Telephone Encounter (Signed)
And 12-11-2021 the cyp2c9 testing  (note) lab scanned.  (I'm sorry I just saw this needing for PA)

## 2023-02-06 NOTE — Telephone Encounter (Signed)
I spoke with Joshua Boyer at [facility] and let her know the Mayzent has been approved. She was able to get a paid claim and will alert their team to contact the patient for a refill. I also sent the patient a MyChart message to let him know.

## 2023-02-06 NOTE — Telephone Encounter (Signed)
Pharmacy Patient Advocate Encounter  Received notification from  [employer]  that Prior Authorization for Mayzent 2MG  tablets has been APPROVED from 02/05/2023 to 02/05/2024. Ran test claim, Copay is $4.00. This test claim was processed through [facility]- copay amounts may vary at other pharmacies due to pharmacy/plan contracts, or as the patient moves through the different stages of their insurance plan.   PA #/Case ID/Reference #: PA Case ID #: [id number]

## 2023-02-11 ENCOUNTER — Ambulatory Visit: Payer: No Typology Code available for payment source | Admitting: Adult Health

## 2023-02-11 ENCOUNTER — Encounter: Payer: Self-pay | Admitting: Adult Health

## 2023-02-11 VITALS — BP 130/85 | HR 92 | Ht 76.0 in | Wt 188.0 lb

## 2023-02-11 DIAGNOSIS — G35 Multiple sclerosis: Secondary | ICD-10-CM

## 2023-02-11 NOTE — Telephone Encounter (Signed)
Need to check insurance and redo PA for MRI.

## 2023-02-11 NOTE — Progress Notes (Signed)
PATIENT: Joshua Boyer DOB: [birth year]  REASON FOR VISIT: follow up HISTORY FROM: patient  Chief Complaint  Patient presents with   Follow-up    Patient in room #19 and alone. Patient states he is well and stable with no new concerns.     HISTORY OF PRESENT ILLNESS: Today 02/11/23:  RAHUL MALINAK is a 58 y.o. male with a history of multiple sclerosis. Returns today for follow-up.  He remains on Mayzent.  Overall feels that he is doing well.  No new symptoms.  No change in his gait or balance.  No new numbness or weakness.  On occasion he states that he will have tingling in the right foot but this is very infrequent.  No changes with the bowels or bladder.  No changes with his vision.  At the last visit we did order MRI of the brain however he did not have this done due to insurance not covering.  He also states that he is the primary caregiver for his mother and father who are both bedridden.  He returns today for an evaluation.    08/01/22: TANYA MARVIN is a 58 y.o. male with a history of multiple sclerosis. Returns today for follow-up.  Overall he has been doing well.  He is now on Mayzent.  Tolerating the medication well.  Denies any new symptoms.  No change in his gait or balance.  No new numbness or weakness.  Reports occasionally he gets some tingling in the toes.  No changes with the bowels or bladder.  No changes with his vision.  His last MRI of the brain was in November 2022.  He returns today for an evaluation.   10/26/21:    Mr. Lammert is a 57 year old male with a history of multiple sclerosis.  He returns today for follow-up.  The patient's insurance no longer covers Gilenya brand-name.  He has tried generic version but has GI upset.  This visit was scheduled to discuss other treatment options.  In regards to his multiple sclerosis he states that everything is stable.  No new symptoms.  His last MRI of the brain was in November 2022-it did show 1 new focus in the left  frontal lobe.  Today we discussed Zeposia and Mayzent.      05/14/21: Mr. Pacholski is a 58 year old male with a history of multiple sclerosis.  He returns today for follow-up.  At the last visit he was restarted on Gilenya restarted Gilenya due to new lesion on MRI report. Reports that he has had tingling in the feet- right foot. Very infrequent and only las <1 minute. No changes with bowels or bladder. No changes with vision. No changes with Gait or balance.  He returns today for follow-up.   11/13/20:Mr. Halley is a 58 year old male with a history of relapsed remitting multiple sclerosis.  He returns today for follow-up.  He is on Gilenya.  Reports that he is doing well.  Denies any new symptoms.  No new numbness or weakness.  No changes with his vision.  No changes with the bowels or bladder.  Reports that his mood is stable.  His last MRI of the brain was in 2021.  He returns today for an evaluation.  05/11/20: Mr. Novelo is a 58 year old male with a history of relapsed remitting multiple sclerosis.  He returns today for follow-up.  He remains on Gilenya.  Denies any new symptoms.  Denies any new numbness or weakness.  No changes  with his gait or balance.  No significant changes with his vision.  Reports on occasion he will have sharp pain that lasts less than 1 second in both eyes.  However he states that he does not do a great job with changing out his contacts.  He plans to follow-up with Dr. Dione Booze.  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  07/05/19: Mr. Hemp is a 58 year old male with a history of relapsed remitting multiple sclerosis.  He returns today for follow-up.  He states that he has been off Gilenya for approximately 1 year.  He states that he stopped the medication during the pandemic as he was laid off from his job.  Reports that he could not r afford out-of-pocket cost.  He denies any new symptoms.  No change with his gait or  balance.  No new numbness or weakness.  No changes with his vision.  Overall he feels that he has been doing well.  He returns today for follow-up.  HISTORY 02/27/18  Mr. Campillo is a 58 year old African-American male with history of MS who presents for follow-up today.  The patient remains on Gilenya and is doing well with this medication.  He reports he had an exacerbation back in September 2019 with left arm and leg numbness, balance change, fatigue, and change in the vision in his left eye.  He was treated with a prednisone Dosepak and his symptoms resolved shortly thereafter.  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  He denies any problems or concerns today.  He denies any new symptoms of numbness or weakness.  He denies any problems with his bowels or bladder.  He denies any falls.  He denies any changes in his vision.  He reports that he is currently in between jobs and looking for a new job.  He had a normal eye exam in September 2019 with Dr. Nile Riggs.  He reports that he is sleeping better now that he is taking CBD with melatonin.  He presents today for follow-up unaccompanied.    REVIEW OF SYSTEMS: Out of a complete 14 system review of symptoms, the patient complains only of the following symptoms, and all other reviewed systems are negative.  See HPI  ALLERGIES: No Known Allergies  HOME MEDICATIONS: Outpatient Medications Prior to Visit  Medication Sig Dispense Refill   Cholecalciferol (VITAMIN D3) 1000 UNITS CAPS Take 1 capsule (1,000 Units total) by mouth daily.     MAYZENT 2 MG TABS TAKE 1 TABLET BY MOUTH 1 TIME A DAY 30 tablet 11   No facility-administered medications prior to visit.    PAST MEDICAL HISTORY: Past Medical History:  Diagnosis Date   Chronic insomnia 10/07/2017   Migraine headache    MS (multiple sclerosis) (HCC)    Multiple sclerosis (HCC) 11/27/2012   Optic neuritis, left     PAST SURGICAL  HISTORY: Past Surgical History:  Procedure Laterality Date   HAND TENDON SURGERY     HERNIA REPAIR     KNEE SURGERY     ROTATOR CUFF REPAIR      FAMILY HISTORY: Family History  Problem Relation Age of Onset   Glaucoma Mother    Diabetes Mother    Stroke Father    Heart attack Father    Cancer - Prostate Father    Multiple sclerosis Neg Hx  SOCIAL HISTORY: Social History   Socioeconomic History   Marital status: Divorced    Spouse name: Not on file   Number of children: 1   Years of education: BS    Highest education level: Not on file  Occupational History    Employer: [employer]  Tobacco Use   Smoking status: Every Day    Current packs/day: 0.50    Average packs/day: 0.5 packs/day for 30.0 years (15.0 ttl pk-yrs)    Types: Cigarettes   Smokeless tobacco: Never  Vaping Use   Vaping status: Never Used  Substance and Sexual Activity   Alcohol use: Yes    Alcohol/week: 0.0 standard drinks of alcohol    Comment: social   Drug use: No   Sexual activity: Never  Other Topics Concern   Not on file  Social History Narrative   Patient lives at home. His father lives with him and they help each other out.    Patient has 1 child.    Patient is currently working.    Patient is right handed.    Patient has his BS degree.   Patient drinks at least 1-2 cups of caffeine daily.      Social Drivers of Corporate investment banker Strain: Not on file  Food Insecurity: Not on file  Transportation Needs: Not on file  Physical Activity: Not on file  Stress: Not on file  Social Connections: Not on file  Intimate Partner Violence: Not on file      PHYSICAL EXAM  Vitals:   02/11/23 1112  BP: 130/85  Pulse: 92  Weight: 188 lb (85.3 kg)  Height: 6\' 4"  (1.93 m)     Body mass index is 22.88 kg/m.  Generalized: Well developed, in no acute distress   Neurological examination  Mentation: Alert oriented to time, place, history taking. Follows all commands speech and  language fluent Cranial nerve II-XII: Pupils were equal round reactive to light. Extraocular movements were full, visual field were full on confrontational test. . Head turning and shoulder shrug  were normal and symmetric. Motor: The motor testing reveals 5 over 5 strength of all 4 extremities. Good symmetric motor tone is noted throughout.  Sensory: Sensory testing is intact to soft touch on all 4 extremities. No evidence of extinction is noted.  Coordination: Cerebellar testing reveals good finger-nose-finger and heel-to-shin bilaterally.  Gait and station: Gait is normal.   Reflexes: Deep tendon reflexes are symmetric but the depressed throughout  DIAGNOSTIC DATA (LABS, IMAGING, TESTING) - I reviewed patient records, labs, notes, testing and imaging myself where available.  Lab Results  Component Value Date   WBC 9.4 08/01/2022   HGB 14.0 08/01/2022   HCT 41.8 08/01/2022   MCV 90 08/01/2022   PLT 290 08/01/2022      Component Value Date/Time   NA 142 08/01/2022 0856   K 4.2 08/01/2022 0856   CL 103 08/01/2022 0856   CO2 23 08/01/2022 0856   GLUCOSE 130 (H) 08/01/2022 0856   GLUCOSE 99 06/04/2019 1451   BUN 23 08/01/2022 0856   CREATININE 1.32 (H) 08/01/2022 0856   CREATININE 1.15 06/04/2019 1451   CALCIUM 9.7 08/01/2022 0856   PROT 6.7 08/01/2022 0856   ALBUMIN 4.4 08/01/2022 0856   AST 15 08/01/2022 0856   ALT 16 08/01/2022 0856   ALKPHOS 65 08/01/2022 0856   BILITOT 0.3 08/01/2022 0856   GFRNONAA 65 02/27/2018 1009   GFRAA 75 02/27/2018 1009   Lab Results  Component Value  Date   CHOL 219 (H) 06/04/2019   HDL 38 (L) 06/04/2019   LDLCALC 158 (H) 06/04/2019   TRIG 114 06/04/2019   CHOLHDL 5.8 (H) 06/04/2019    Lab Results  Component Value Date   TSH 0.97 06/04/2019      ASSESSMENT AND PLAN 58 y.o. year old male  has a past medical history of Chronic insomnia (10/07/2017), Migraine headache, MS (multiple sclerosis) (HCC), Multiple sclerosis (HCC)  (11/27/2012), and Optic neuritis, left. here with:  Relapsed remitting multiple sclerosis   Continue  Mayzent Blood work today- CBC.CMP MRI of the brain with and without contrast to look for progression of MS Advised if symptoms worsen or he develops new symptoms he should let us know Follow-up in 6 months or sooner if needed  Patient was previously a patient of Dr. Anne Hahn.  We will get him established with a new neurologist at the next visit   Butch Penny, MSN, NP-C 02/11/2023, 11:07 AM [facility] [address], [address] [place], [place] [address] [phone]

## 2023-02-11 NOTE — Patient Instructions (Signed)
Your Plan:  Continue Mayzent Blood work today     Thank you for coming to see [place] at [facility]. I hope we have been able to provide you high quality care today.  You may receive a patient satisfaction survey over the next few weeks. We would appreciate your feedback and comments so that we may continue to improve ourselves and the health of our patients.

## 2023-02-12 ENCOUNTER — Encounter: Payer: Self-pay | Admitting: Adult Health

## 2023-02-12 LAB — CBC WITH DIFFERENTIAL/PLATELET
Basophils Absolute: 0 10*3/uL (ref 0.0–0.2)
Basos: 0 %
EOS (ABSOLUTE): 0.1 10*3/uL (ref 0.0–0.4)
Eos: 1 %
Hematocrit: 43.2 % (ref 37.5–51.0)
Hemoglobin: 14.1 g/dL (ref 13.0–17.7)
Immature Grans (Abs): 0 10*3/uL (ref 0.0–0.1)
Immature Granulocytes: 0 %
Lymphocytes Absolute: 1 10*3/uL (ref 0.7–3.1)
Lymphs: 9 %
MCH: 30 pg (ref 26.6–33.0)
MCHC: 32.6 g/dL (ref 31.5–35.7)
MCV: 92 fL (ref 79–97)
Monocytes Absolute: 1 10*3/uL — ABNORMAL HIGH (ref 0.1–0.9)
Monocytes: 10 %
Neutrophils Absolute: 8.2 10*3/uL — ABNORMAL HIGH (ref 1.4–7.0)
Neutrophils: 80 %
Platelets: 317 10*3/uL (ref 150–450)
RBC: 4.7 x10E6/uL (ref 4.14–5.80)
RDW: 14.1 % (ref 11.6–15.4)
WBC: 10.3 10*3/uL (ref 3.4–10.8)

## 2023-02-12 LAB — COMPREHENSIVE METABOLIC PANEL
ALT: 19 [IU]/L (ref 0–44)
AST: 15 [IU]/L (ref 0–40)
Albumin: 4.4 g/dL (ref 3.8–4.9)
Alkaline Phosphatase: 70 [IU]/L (ref 44–121)
BUN/Creatinine Ratio: 14 (ref 9–20)
BUN: 17 mg/dL (ref 6–24)
Bilirubin Total: 0.2 mg/dL (ref 0.0–1.2)
CO2: 26 mmol/L (ref 20–29)
Calcium: 9.5 mg/dL (ref 8.7–10.2)
Chloride: 104 mmol/L (ref 96–106)
Creatinine, Ser: 1.25 mg/dL (ref 0.76–1.27)
Globulin, Total: 2.5 g/dL (ref 1.5–4.5)
Glucose: 86 mg/dL (ref 70–99)
Potassium: 4.5 mmol/L (ref 3.5–5.2)
Sodium: 143 mmol/L (ref 134–144)
Total Protein: 6.9 g/dL (ref 6.0–8.5)
eGFR: 68 mL/min/{1.73_m2} (ref >59)

## 2023-02-13 NOTE — Telephone Encounter (Signed)
healthy blue Berkley Harvey: [id number] exp. 02/13/23-04/13/23 sent to [facility] [phone]

## 2023-02-17 ENCOUNTER — Telehealth: Payer: Self-pay

## 2023-02-17 MED ORDER — MAYZENT 2 MG PO TABS: ORAL_TABLET | ORAL | 11 refills | Status: DC

## 2023-02-17 NOTE — Telephone Encounter (Signed)
Ok I sent a prescription to [facility].

## 2023-02-17 NOTE — Telephone Encounter (Signed)
I received a form via bulk faxes for PA's-this is not a PA request it is a request for prescription clarification-I faxed back to [facility] with attention to POD 4-Joshua Boyer patient.

## 2023-03-11 ENCOUNTER — Telehealth: Payer: Self-pay | Admitting: Adult Health

## 2023-03-11 NOTE — Telephone Encounter (Signed)
 [facility] have been trying to in touch with patient to notify [facility] is ready. Patient's voicemail is full.

## 2023-03-13 ENCOUNTER — Telehealth: Payer: Self-pay | Admitting: Adult Health

## 2023-03-13 NOTE — Telephone Encounter (Signed)
 Received call from [facility] states pt's insurance [employer] Next plan is OON with their office. Pt will need to be referred somewhere in network with their insurance.

## 2023-03-17 NOTE — Telephone Encounter (Signed)
 healthy blue Berkley Harvey: [id number] exp. 02/13/23-04/13/23  [employer] Berkley Harvey: [id number] exp. 03/17/23-04/16/23 Sent to [facility] in [place]. [phone]

## 2023-03-21 ENCOUNTER — Other Ambulatory Visit: Payer: Medicaid Other

## 2023-04-22 ENCOUNTER — Telehealth: Payer: Self-pay | Admitting: *Deleted

## 2023-04-22 NOTE — Telephone Encounter (Signed)
 Received fax from [facility] result of MRI brain w/wo 03-26-2023.  Placed in inbox.

## 2023-04-22 NOTE — Telephone Encounter (Signed)
 I reviewed the results.  Is states unchanged distribution of white matter lesions consistent with multiple sclerosis.  No new or enhancing lesions.  Please make patient aware that his MRI is stable.

## 2023-04-22 NOTE — Telephone Encounter (Signed)
 I called pt and relayed the results of his MRI unchanged distribution of white matter lesions consistent with MS.  No new or enhancing lesions.  MRI stable.  He verbalized understanding. Thanked [place] for the call.

## 2023-08-28 ENCOUNTER — Ambulatory Visit (INDEPENDENT_AMBULATORY_CARE_PROVIDER_SITE_OTHER): Payer: No Typology Code available for payment source | Admitting: Neurology

## 2023-08-28 ENCOUNTER — Encounter: Payer: Self-pay | Admitting: Neurology

## 2023-08-28 VITALS — BP 120/73 | HR 92 | Ht 76.0 in | Wt 188.5 lb

## 2023-08-28 DIAGNOSIS — H469 Unspecified optic neuritis: Secondary | ICD-10-CM | POA: Diagnosis not present

## 2023-08-28 DIAGNOSIS — Z79899 Other long term (current) drug therapy: Secondary | ICD-10-CM | POA: Diagnosis not present

## 2023-08-28 DIAGNOSIS — G35 Multiple sclerosis: Secondary | ICD-10-CM

## 2023-08-28 DIAGNOSIS — E559 Vitamin D deficiency, unspecified: Secondary | ICD-10-CM | POA: Diagnosis not present

## 2023-08-28 NOTE — Progress Notes (Signed)
 [facility]  PATIENT: Joshua Boyer DOB: November 22, 1965     HISTORICAL  CHIEF COMPLAINT:  Chief Complaint  Patient presents with   Follow-up    Pt in room 10.alone. Here for MS follow up on Mayzent . Patient reports doing okay, no new or worsening symptoms. Pt report has tingling in toes, last about 10 seconds and goes away. No falls,  no recent eye exam.    HISTORY OF PRESENT ILLNESS:  Update 08/28/2023 He I a 58 yo man who was diagnosed with MS in 2012/2013 after presenting with left optic neurtis.   MRI was c/w MS.  He was placed on GIlenya .   He has been on Mayzent  since 2020 due to insurance issues.  He has tolerated it well.  His last exacerbation was back in 2018 or 2019.      Currently, his gait is mildly of balanced and he sometimes uses the rails on stairs.  He denies any difficulty with strength or sensation.  His left eye s slightly reduced.   Bladder function is fine.   He denies difficulty with mood or cognition.    His father has a lot of health issues.   Joshua Boyer was in the ED all night with him and is more tired than usual today.  He started using a patch (X39 similar to Nellene patch)  MRI 12/09/2020 showed Multiple T2/FLAIR hyperintense foci in the periventricular, juxtacortical and deep white matter of the hemispheres. These are consistent with chronic demyelinating plaque associated with multiple sclerosis. None of the foci enhance or appear to be acute. However, compared to the MRI dated 07/15/2019, there was 1 new focus in the juxtacortical white matter of the left frontal lobe.Joshua Boyer   MRI brain 03/26/2023 ([facility]) by report showed no new lesions.     From Joshua Russell, NP 02/11/2023 Joshua Boyer is a 58 y.o. male with a history of multiple sclerosis. Returns today for follow-up.  He remains on Mayzent .  Overall feels that he is doing well.  No new symptoms.  No change in his gait or balance.  No new numbness or weakness.  On occasion he states that he  will have tingling in the right foot but this is very infrequent.  No changes with the bowels or bladder.  No changes with his vision.  At the last visit we did order MRI of the brain however he did not have this done due to insurance not covering.  He also states that he is the primary caregiver for his mother and father who are both bedridden.  He returns today for an evaluation.   ROS:  Out of a complete 14 system review of symptoms, the patient complains only of the following symptoms, and all other reviewed systems are negative.  None   ALLERGIES: No Known Allergies  HOME MEDICATIONS:  Current Outpatient Medications:    Cholecalciferol (VITAMIN D3) 1000 UNITS CAPS, Take 1 capsule (1,000 Units total) by mouth daily., Disp: , Rfl:    Siponimod  Fumarate (MAYZENT ) 2 MG TABS, TAKE 1 TABLET BY MOUTH 1 TIME A DAY, Disp: 30 tablet, Rfl: 11  PAST MEDICAL HISTORY: Past Medical History:  Diagnosis Date   Chronic insomnia 10/07/2017   Migraine headache    MS (multiple sclerosis) (HCC)    Multiple sclerosis (HCC) 11/27/2012   Optic neuritis, left     PAST SURGICAL HISTORY: Past Surgical History:  Procedure Laterality Date   HAND TENDON SURGERY     HERNIA REPAIR  KNEE SURGERY     ROTATOR CUFF REPAIR      FAMILY HISTORY: Family History  Problem Relation Age of Onset   Glaucoma Mother    Diabetes Mother    Stroke Father    Heart attack Father    Cancer - Prostate Father    Multiple sclerosis Neg Hx     SOCIAL HISTORY:  Social History   Socioeconomic History   Marital status: Divorced    Spouse name: Not on file   Number of children: 1   Years of education: BS    Highest education level: Not on file  Occupational History    Employer: [employer]  Tobacco Use   Smoking status: Every Day    Current packs/day: 0.50    Average packs/day: 0.5 packs/day for 30.0 years (15.0 ttl pk-yrs)    Types: Cigarettes   Smokeless tobacco: Never  Vaping Use   Vaping status: Never Used   Substance and Sexual Activity   Alcohol use: Yes    Alcohol/week: 0.0 standard drinks of alcohol    Comment: social   Drug use: No   Sexual activity: Never  Other Topics Concern   Not on file  Social History Narrative   Patient lives at home. His father lives with him and they help each other out.    Patient has 1 child.    Patient is currently working.    Patient is right handed.    Patient has his BS degree.   Patient drinks at least 1-2 cups of caffeine daily.      Social Drivers of Corporate investment banker Strain: Not on file  Food Insecurity: Not on file  Transportation Needs: Not on file  Physical Activity: Not on file  Stress: Not on file  Social Connections: Not on file  Intimate Partner Violence: Not on file     PHYSICAL EXAM  Vitals:   08/28/23 1300  BP: 120/73  Pulse: 92  SpO2: 98%  Weight: 188 lb 8 oz (85.5 kg)  Height: 6' 4 (1.93 m)    Body mass index is 22.94 kg/m.   General: The patient is well-developed and well-nourished and in no acute distress   Neck: The neck is supple, no carotid bruits are noted.  The neck is nontender.  Skin: Extremities are without significant edema.  Musculoskeletal:  Back is nontender  Neurologic Exam  Mental status: The patient is alert and oriented x 3 at the time of the examination. The patient has apparent normal recent and remote memory, with an apparently normal attention span and concentration ability.   Speech is normal.  Cranial nerves: Slight reduced color vision OS.   Extraocular movements are full  Facial symmetry is present. There is good facial sensation to soft touch bilaterally.Facial strength is normal.  Trapezius and sternocleidomastoid strength is normal. No dysarthria is noted.  The tongue is midline, and the patient has symmetric elevation of the soft palate. No obvious hearing deficits are noted.  Motor:  Muscle bulk is normal.   Tone is normal. Strength is  5 / 5 in all 4 extremities.    Sensory: Sensory testing is intact to pinprick, soft touch and vibration sensation in all 4 extremities.  Coordination: Cerebellar testing reveals good finger-nose-finger and heel-to-shin bilaterally.  Gait and station: Station is normal.   Gait is normal. Tandem gait is normal. Romberg is negative.   Reflexes: Deep tendon reflexes are symmetric and normal bilaterally.     DIAGNOSTIC DATA (LABS,  IMAGING, TESTING) - I reviewed patient records, labs, notes, testing and imaging myself where available.  Lab Results  Component Value Date   WBC 10.3 02/11/2023   HGB 14.1 02/11/2023   HCT 43.2 02/11/2023   MCV 92 02/11/2023   PLT 317 02/11/2023      Component Value Date/Time   NA 143 02/11/2023 1136   K 4.5 02/11/2023 1136   CL 104 02/11/2023 1136   CO2 26 02/11/2023 1136   GLUCOSE 86 02/11/2023 1136   GLUCOSE 99 06/04/2019 1451   BUN 17 02/11/2023 1136   CREATININE 1.25 02/11/2023 1136   CREATININE 1.15 06/04/2019 1451   CALCIUM 9.5 02/11/2023 1136   PROT 6.9 02/11/2023 1136   ALBUMIN 4.4 02/11/2023 1136   AST 15 02/11/2023 1136   ALT 19 02/11/2023 1136   ALKPHOS 70 02/11/2023 1136   BILITOT 0.2 02/11/2023 1136   GFRNONAA 65 02/27/2018 1009   GFRAA 75 02/27/2018 1009   Lab Results  Component Value Date   CHOL 219 (H) 06/04/2019   HDL 38 (L) 06/04/2019   LDLCALC 158 (H) 06/04/2019   TRIG 114 06/04/2019   CHOLHDL 5.8 (H) 06/04/2019   No results found for: HGBA1C No results found for: VITAMINB12 Lab Results  Component Value Date   TSH 0.97 06/04/2019       ASSESSMENT AND PLAN  Multiple sclerosis (HCC) - Plan: CBC with Differential/Platelet  Optic neuritis  High risk medication use - Plan: CBC with Differential/Platelet  Vitamin D  deficiency - Plan: VITAMIN D  25 Hydroxy (Vit-D Deficiency, Fractures)   Continue Mayzent .  Check CBC/D Check Vit D level and supp as needed Stay active and exercise as needed Rtc 6-7 months, sooner if new or worsening  issues   Mikal Blasdell A. Vear, MD, PhD, DIEDRA 08/28/2023, 1:09 PM Certified in Neurology, Clinical Neurophysiology, Sleep Medicine, Pain Medicine and Neuroimaging  [facility] [address], [address] [place], [place] [address] [phone]

## 2023-08-29 ENCOUNTER — Ambulatory Visit: Payer: Self-pay | Admitting: Neurology

## 2023-08-29 LAB — CBC WITH DIFFERENTIAL/PLATELET
Basophils Absolute: 0 x10E3/uL (ref 0.0–0.2)
Basos: 0 %
EOS (ABSOLUTE): 0.1 x10E3/uL (ref 0.0–0.4)
Eos: 1 %
Hematocrit: 43.1 % (ref 37.5–51.0)
Hemoglobin: 14.2 g/dL (ref 13.0–17.7)
Immature Grans (Abs): 0 x10E3/uL (ref 0.0–0.1)
Immature Granulocytes: 0 %
Lymphocytes Absolute: 0.9 x10E3/uL (ref 0.7–3.1)
Lymphs: 10 %
MCH: 30.7 pg (ref 26.6–33.0)
MCHC: 32.9 g/dL (ref 31.5–35.7)
MCV: 93 fL (ref 79–97)
Monocytes Absolute: 0.9 x10E3/uL (ref 0.1–0.9)
Monocytes: 11 %
Neutrophils Absolute: 6.5 x10E3/uL (ref 1.4–7.0)
Neutrophils: 78 %
Platelets: 304 x10E3/uL (ref 150–450)
RBC: 4.62 x10E6/uL (ref 4.14–5.80)
RDW: 14.4 % (ref 11.6–15.4)
WBC: 8.3 x10E3/uL (ref 3.4–10.8)

## 2023-08-29 LAB — VITAMIN D 25 HYDROXY (VIT D DEFICIENCY, FRACTURES): Vit D, 25-Hydroxy: 49.7 ng/mL (ref 30.0–100.0)

## 2024-01-02 ENCOUNTER — Other Ambulatory Visit: Payer: Self-pay | Admitting: Adult Health

## 2024-01-05 ENCOUNTER — Telehealth: Payer: Self-pay

## 2024-01-05 ENCOUNTER — Other Ambulatory Visit (HOSPITAL_COMMUNITY): Payer: Self-pay

## 2024-01-05 NOTE — Telephone Encounter (Signed)
 Pharmacy Patient Advocate Encounter   Received notification from RX Request Messages that prior authorization for Mayzent  2mg  Tablet is required/requested.   Insurance verification completed.   The patient is insured through [employer].   Per test claim: Refill too soon. PA is not needed at this time. Medication was filled 12/16/2023. Next eligible fill date is 01/07/2024.

## 2024-01-05 NOTE — Telephone Encounter (Signed)
 Noted

## 2024-02-09 ENCOUNTER — Telehealth: Payer: Self-pay

## 2024-02-09 ENCOUNTER — Other Ambulatory Visit (HOSPITAL_COMMUNITY): Payer: Self-pay

## 2024-02-09 NOTE — Telephone Encounter (Signed)
 Pharmacy Patient Advocate Encounter   Received notification from CoverMyMeds that prior authorization for Mayzent  2MG  tablets  is required/requested.   Insurance verification completed.   The patient is insured through [employer].   Prior Authorization form/request asks a question that requires your assistance. Please see the question below and advise accordingly. The PA will not be submitted until the necessary information is received.   Has the patient tried and failed TWO formulary preferred medications?    Only seeing fingolimod  on file. Not finding record of having tried/failed others. Please advise.

## 2024-02-10 ENCOUNTER — Other Ambulatory Visit: Payer: Self-pay | Admitting: Neurology

## 2024-02-10 MED ORDER — FINGOLIMOD HCL 0.5 MG PO CAPS: ORAL_CAPSULE | ORAL | 11 refills | Status: AC

## 2024-02-10 NOTE — Telephone Encounter (Signed)
 Pt called in in returned my call and stated that he is agreeable to swith. Pt stated that he has several mayzent  tabs left and wants to know if he can continue that until out. He then wants to know if he can start fingolimod  instantly or is there a wash out period? I told the pt that I would relay these questions to the provider and be back in touch soon

## 2024-02-10 NOTE — Telephone Encounter (Signed)
 UNABLE TO LEAVE MESSAGE 1ST ATTEMPT BY HF 02/10/24

## 2024-02-16 ENCOUNTER — Other Ambulatory Visit (HOSPITAL_COMMUNITY): Payer: Self-pay

## 2024-02-17 NOTE — Telephone Encounter (Signed)
 SABRA

## 2024-05-10 ENCOUNTER — Ambulatory Visit: Admitting: Adult Health
